# Patient Record
Sex: Female | Born: 1970 | Hispanic: Yes | Marital: Married | Smoking: Never smoker
Health system: Southern US, Community
[De-identification: ages and names within clinical notes are randomized; demographics above are authoritative.]

## PROBLEM LIST (undated history)

## (undated) ENCOUNTER — Inpatient Hospital Stay (HOSPITAL_COMMUNITY): Payer: Self-pay

## (undated) DIAGNOSIS — F419 Anxiety disorder, unspecified: Secondary | ICD-10-CM

## (undated) DIAGNOSIS — N390 Urinary tract infection, site not specified: Secondary | ICD-10-CM

## (undated) DIAGNOSIS — F329 Major depressive disorder, single episode, unspecified: Secondary | ICD-10-CM

## (undated) DIAGNOSIS — K649 Unspecified hemorrhoids: Secondary | ICD-10-CM

## (undated) DIAGNOSIS — N809 Endometriosis, unspecified: Secondary | ICD-10-CM

## (undated) DIAGNOSIS — F32A Depression, unspecified: Secondary | ICD-10-CM

## (undated) DIAGNOSIS — N926 Irregular menstruation, unspecified: Secondary | ICD-10-CM

## (undated) HISTORY — DX: Major depressive disorder, single episode, unspecified: F32.9

## (undated) HISTORY — DX: Irregular menstruation, unspecified: N92.6

## (undated) HISTORY — PX: APPENDECTOMY: SHX54

## (undated) HISTORY — DX: Anxiety disorder, unspecified: F41.9

## (undated) HISTORY — DX: Urinary tract infection, site not specified: N39.0

## (undated) HISTORY — DX: Depression, unspecified: F32.A

---

## 1999-02-18 ENCOUNTER — Encounter: Payer: Self-pay | Admitting: Family Medicine

## 1999-02-18 ENCOUNTER — Ambulatory Visit (HOSPITAL_COMMUNITY): Admission: RE | Admit: 1999-02-18 | Discharge: 1999-02-18 | Payer: Self-pay | Admitting: Family Medicine

## 1999-12-31 ENCOUNTER — Inpatient Hospital Stay (HOSPITAL_COMMUNITY): Admission: EM | Admit: 1999-12-31 | Discharge: 2000-01-01 | Payer: Self-pay

## 2000-01-05 ENCOUNTER — Emergency Department (HOSPITAL_COMMUNITY): Admission: EM | Admit: 2000-01-05 | Discharge: 2000-01-05 | Payer: Self-pay | Admitting: Emergency Medicine

## 2000-01-24 ENCOUNTER — Encounter: Payer: Self-pay | Admitting: Obstetrics and Gynecology

## 2000-01-24 ENCOUNTER — Ambulatory Visit (HOSPITAL_COMMUNITY): Admission: RE | Admit: 2000-01-24 | Discharge: 2000-01-24 | Payer: Self-pay | Admitting: Obstetrics and Gynecology

## 2000-02-29 ENCOUNTER — Encounter: Admission: RE | Admit: 2000-02-29 | Discharge: 2000-02-29 | Payer: Self-pay | Admitting: Obstetrics

## 2000-10-25 ENCOUNTER — Ambulatory Visit (HOSPITAL_COMMUNITY): Admission: RE | Admit: 2000-10-25 | Discharge: 2000-10-25 | Payer: Self-pay | Admitting: Family Medicine

## 2000-10-25 ENCOUNTER — Encounter: Payer: Self-pay | Admitting: Family Medicine

## 2000-10-31 ENCOUNTER — Inpatient Hospital Stay (HOSPITAL_COMMUNITY): Admission: AD | Admit: 2000-10-31 | Discharge: 2000-10-31 | Payer: Self-pay | Admitting: *Deleted

## 2000-11-06 ENCOUNTER — Encounter: Payer: Self-pay | Admitting: Family Medicine

## 2000-11-06 ENCOUNTER — Ambulatory Visit (HOSPITAL_COMMUNITY): Admission: RE | Admit: 2000-11-06 | Discharge: 2000-11-06 | Payer: Self-pay | Admitting: Family Medicine

## 2000-11-30 ENCOUNTER — Ambulatory Visit (HOSPITAL_COMMUNITY): Admission: RE | Admit: 2000-11-30 | Discharge: 2000-11-30 | Payer: Self-pay | Admitting: Family Medicine

## 2000-11-30 ENCOUNTER — Encounter: Payer: Self-pay | Admitting: Family Medicine

## 2001-04-02 ENCOUNTER — Ambulatory Visit (HOSPITAL_COMMUNITY): Admission: RE | Admit: 2001-04-02 | Discharge: 2001-04-02 | Payer: Self-pay | Admitting: Family Medicine

## 2001-04-02 ENCOUNTER — Encounter: Payer: Self-pay | Admitting: Family Medicine

## 2001-05-02 ENCOUNTER — Ambulatory Visit (HOSPITAL_COMMUNITY): Admission: RE | Admit: 2001-05-02 | Discharge: 2001-05-02 | Payer: Self-pay | Admitting: Obstetrics

## 2001-05-17 ENCOUNTER — Encounter: Admission: RE | Admit: 2001-05-17 | Discharge: 2001-05-17 | Payer: Self-pay | Admitting: Obstetrics

## 2001-06-08 ENCOUNTER — Encounter: Admission: RE | Admit: 2001-06-08 | Discharge: 2001-06-08 | Payer: Self-pay | Admitting: Obstetrics

## 2001-06-22 ENCOUNTER — Encounter: Admission: RE | Admit: 2001-06-22 | Discharge: 2001-06-22 | Payer: Self-pay | Admitting: Obstetrics & Gynecology

## 2001-07-24 ENCOUNTER — Encounter: Admission: RE | Admit: 2001-07-24 | Discharge: 2001-07-24 | Payer: Self-pay | Admitting: Obstetrics & Gynecology

## 2001-07-30 ENCOUNTER — Encounter: Admission: RE | Admit: 2001-07-30 | Discharge: 2001-07-30 | Payer: Self-pay | Admitting: Obstetrics

## 2001-09-13 ENCOUNTER — Encounter: Admission: RE | Admit: 2001-09-13 | Discharge: 2001-09-13 | Payer: Self-pay | Admitting: Obstetrics

## 2001-09-27 ENCOUNTER — Encounter: Admission: RE | Admit: 2001-09-27 | Discharge: 2001-09-27 | Payer: Self-pay | Admitting: Obstetrics

## 2001-10-10 ENCOUNTER — Encounter: Admission: RE | Admit: 2001-10-10 | Discharge: 2001-10-10 | Payer: Self-pay | Admitting: Internal Medicine

## 2001-10-15 ENCOUNTER — Ambulatory Visit (HOSPITAL_COMMUNITY): Admission: RE | Admit: 2001-10-15 | Discharge: 2001-10-15 | Payer: Self-pay | Admitting: Internal Medicine

## 2001-10-15 ENCOUNTER — Encounter: Payer: Self-pay | Admitting: Internal Medicine

## 2001-11-06 ENCOUNTER — Encounter: Admission: RE | Admit: 2001-11-06 | Discharge: 2001-11-06 | Payer: Self-pay | Admitting: *Deleted

## 2001-12-20 ENCOUNTER — Encounter: Admission: RE | Admit: 2001-12-20 | Discharge: 2001-12-20 | Payer: Self-pay | Admitting: Obstetrics and Gynecology

## 2002-03-21 ENCOUNTER — Encounter: Admission: RE | Admit: 2002-03-21 | Discharge: 2002-03-21 | Payer: Self-pay | Admitting: Obstetrics and Gynecology

## 2002-04-29 ENCOUNTER — Encounter: Admission: RE | Admit: 2002-04-29 | Discharge: 2002-04-29 | Payer: Self-pay | Admitting: Internal Medicine

## 2002-05-22 ENCOUNTER — Encounter: Admission: RE | Admit: 2002-05-22 | Discharge: 2002-05-22 | Payer: Self-pay | Admitting: Internal Medicine

## 2002-05-29 ENCOUNTER — Encounter: Admission: RE | Admit: 2002-05-29 | Discharge: 2002-05-29 | Payer: Self-pay | Admitting: Internal Medicine

## 2002-06-26 ENCOUNTER — Inpatient Hospital Stay (HOSPITAL_COMMUNITY): Admission: AD | Admit: 2002-06-26 | Discharge: 2002-06-26 | Payer: Self-pay | Admitting: Family Medicine

## 2003-05-08 ENCOUNTER — Encounter: Payer: Self-pay | Admitting: Obstetrics & Gynecology

## 2003-05-08 ENCOUNTER — Inpatient Hospital Stay (HOSPITAL_COMMUNITY): Admission: AD | Admit: 2003-05-08 | Discharge: 2003-05-08 | Payer: Self-pay | Admitting: Obstetrics & Gynecology

## 2003-10-01 ENCOUNTER — Inpatient Hospital Stay (HOSPITAL_COMMUNITY): Admission: AD | Admit: 2003-10-01 | Discharge: 2003-10-02 | Payer: Self-pay | Admitting: Obstetrics and Gynecology

## 2003-10-28 ENCOUNTER — Encounter: Admission: RE | Admit: 2003-10-28 | Discharge: 2003-10-28 | Payer: Self-pay | Admitting: Obstetrics and Gynecology

## 2003-11-20 ENCOUNTER — Inpatient Hospital Stay (HOSPITAL_COMMUNITY): Admission: AD | Admit: 2003-11-20 | Discharge: 2003-11-22 | Payer: Self-pay | Admitting: Obstetrics & Gynecology

## 2003-12-03 ENCOUNTER — Encounter: Admission: RE | Admit: 2003-12-03 | Discharge: 2003-12-03 | Payer: Self-pay | Admitting: *Deleted

## 2003-12-10 ENCOUNTER — Encounter: Admission: RE | Admit: 2003-12-10 | Discharge: 2003-12-10 | Payer: Self-pay | Admitting: *Deleted

## 2003-12-17 ENCOUNTER — Encounter: Admission: RE | Admit: 2003-12-17 | Discharge: 2003-12-17 | Payer: Self-pay | Admitting: Obstetrics & Gynecology

## 2003-12-20 ENCOUNTER — Inpatient Hospital Stay (HOSPITAL_COMMUNITY): Admission: AD | Admit: 2003-12-20 | Discharge: 2003-12-23 | Payer: Self-pay | Admitting: *Deleted

## 2003-12-31 ENCOUNTER — Inpatient Hospital Stay (HOSPITAL_COMMUNITY): Admission: AD | Admit: 2003-12-31 | Discharge: 2003-12-31 | Payer: Self-pay | Admitting: Obstetrics and Gynecology

## 2004-01-05 ENCOUNTER — Inpatient Hospital Stay (HOSPITAL_COMMUNITY): Admission: AD | Admit: 2004-01-05 | Discharge: 2004-01-05 | Payer: Self-pay | Admitting: Obstetrics and Gynecology

## 2005-05-23 ENCOUNTER — Encounter: Admission: RE | Admit: 2005-05-23 | Discharge: 2005-05-23 | Payer: Self-pay | Admitting: Gastroenterology

## 2011-01-31 ENCOUNTER — Encounter: Payer: Self-pay | Admitting: Obstetrics and Gynecology

## 2011-05-02 ENCOUNTER — Encounter: Payer: Self-pay | Admitting: Obstetrics & Gynecology

## 2011-05-02 ENCOUNTER — Ambulatory Visit (INDEPENDENT_AMBULATORY_CARE_PROVIDER_SITE_OTHER): Payer: Self-pay | Admitting: Obstetrics & Gynecology

## 2011-05-02 VITALS — BP 128/86 | HR 73 | Temp 97.7°F | Wt 138.2 lb

## 2011-05-02 DIAGNOSIS — N912 Amenorrhea, unspecified: Secondary | ICD-10-CM

## 2011-05-02 LAB — TSH: TSH: 1.773 u[IU]/mL (ref 0.350–4.500)

## 2011-05-02 MED ORDER — MEDROXYPROGESTERONE ACETATE 10 MG PO TABS: 10.0000 mg | ORAL_TABLET | Freq: Every day | ORAL | Status: DC

## 2011-05-02 NOTE — Patient Instructions (Signed)
Amenorrea primaria Falta del perodo, [place] de menstruacin (Primary Amenorrhea, Absent Menses) La amenorrea primaria es la ausencia del flujo menstrual en una mujer que nunca ha menstruado a la edad de 16 aos. La edad promedio para el inicio de la menstruacin es doce aos. No se considera amenorrea primaria hasta que la nia tiene ms de 16 aos y no se ha producido Programmer, systems. Puede producirse en presencia o ausencia de otros signos fsicos de pubertad. CAUSAS Algunas causas comunes de la falta de menstruacin son:  Desnutricin  Hipoglucemia   Enfermedad poliqustica de los ovarios   La ausencia congnita de la vagina, tero u ovarios.   Obesidad extrema  Fibrosis qustica   Reduccin de peso drstica por cualquier causa   Encima de ejercer, corriendo, andando en bicicleta, la prdida causando etc. de grasa del cuerpo.   El tumor de la glndula pituitaria en el cerebro.   Enfermedad crnica (de larga duracin)  Enfermedad de [place]   Hipotiroidismo e hipertiroidismo   La parte del cerebro (el hypothalamus) no funcionando.   El fracaso ovrico prematuro.   DIAGNSTICO Este diagnstico se realiza por medio de la historia clnica y el examen fsico. Con frecuencia se solicitan exmenes de sangre para evaluar las hormonas, junto con algunos anlisis de [place]. Tambin pueden tomarse radiografas especficas. Debe descartarse la posibilidad de embarazo. TRATAMIENTO El tratamiento depende de la causa de la [place]. Si hay un trastorno de Psychologist, sport and exercise, deber tratarse con la dieta y la terapia [place]. Los trastornos crnicos pueden mejorar con el tratamiento de la enfermedad. En general, el pronstico es bueno y Biomedical engineer puede corregirse con medicamentos, cambios en el estilo de vida o con [place]. Si la amenorrea no puede corregirse, algunas veces es posible crear una falsa menstruacin con medicamentos para ayudar a las mujeres jvenes a que se sientan  ms normales. Si tuviera trastornos emocionales, el profesional que la asiste podr [place].  Document Released: 08/08/2005 Document Re-Released: 02/19/2007 [facility] Patient Information 2011 [place], [place].

## 2011-05-02 NOTE — Progress Notes (Signed)
  Subjective:    Meghan Walker is a 40 y.o. G3P3 female who presents for evaluation of amenorrhea.  She reports amenorrhea for the past two years, was evaluated by another physician who could not find an etiology. Reports taking pills to induce period but "they did not work".  After her last pregnancy in around 2006, she was on Depo Provera and then OCPs; last OCP use was three months ago. She reports having one day of mild bleeding on 04/14/11; cannot recall having any bleeding prior to that.  She denies any other symptoms. HCG lab test done? no, negative. Factors that may be contributory to menstrual abnormalities include: hormonal factors Recently stopped oral contraceptive pills/DepoProvera and recent stressors are routine life stresses. Previous treatments for menstrual abnormalities include: oral contraceptive pills, ineffective.  Menstrual History: OB History    Grav Para Term Preterm Abortions TAB SAB Ect Mult Living   3 3 3  0 0 0 0 0 0 3     Patient's last menstrual period was 04/14/2011.    The following portions of the patient's history were reviewed and updated as appropriate: allergies, current medications, past family history, past medical history, past social history, past surgical history and problem list.  Review of Systems A comprehensive review of systems was negative.    Objective:    BP 128/86  Pulse 73  Temp(Src) 97.7 F (36.5 C) (Oral)  Wt 138 lb 3.2 oz (62.687 kg)  LMP 04/14/2011 General:   alert  Skin:   normal and no rash or abnormalities  Lungs:   clear to auscultation bilaterally  Heart:   regular rate and rhythm  Breasts:   not done  Abdomen:  soft, non-tender; bowel sounds normal; no masses,  no organomegaly  Pelvis:  Exam deferred.     Assessment:    The patient has amenorrhea.    Plan:    All questions answered. Will check amenorrhea labs and pelvic ultrasound.  Follow up results and manage accordingly.  Provera challenge given to  patient.  Will follow up in about 3 weeks or earlier if needed.

## 2011-05-03 LAB — FOLLICLE STIMULATING HORMONE: FSH: 4.6 m[IU]/mL

## 2011-05-03 LAB — TESTOSTERONE, FREE, TOTAL, SHBG
Sex Hormone Binding: 59 nmol/L (ref 18–114)
Testosterone, Free: 5.1 pg/mL (ref 0.6–6.8)

## 2011-05-03 LAB — HCG, QUANTITATIVE, PREGNANCY: hCG, Beta Chain, Quant, S: <2 m[IU]/mL

## 2011-05-05 ENCOUNTER — Ambulatory Visit (HOSPITAL_COMMUNITY)
Admission: RE | Admit: 2011-05-05 | Discharge: 2011-05-05 | Disposition: A | Discharge status: Home / Self Care | Payer: Self-pay | Source: Ambulatory Visit | Attending: Obstetrics & Gynecology | Admitting: Obstetrics & Gynecology

## 2011-05-05 DIAGNOSIS — N912 Amenorrhea, unspecified: Secondary | ICD-10-CM | POA: Insufficient documentation

## 2011-05-27 ENCOUNTER — Encounter: Payer: Self-pay | Admitting: Obstetrics and Gynecology

## 2011-05-27 ENCOUNTER — Ambulatory Visit (INDEPENDENT_AMBULATORY_CARE_PROVIDER_SITE_OTHER): Payer: Self-pay | Admitting: Obstetrics and Gynecology

## 2011-05-27 VITALS — BP 116/77 | HR 84 | Temp 97.6°F | Ht <= 58 in | Wt 135.3 lb

## 2011-05-27 DIAGNOSIS — N926 Irregular menstruation, unspecified: Secondary | ICD-10-CM

## 2011-05-27 MED ORDER — NORGESTREL-ETHINYL ESTRADIOL 0.3-30 MG-MCG PO TABS: 1.0000 | ORAL_TABLET | Freq: Every day | ORAL | Status: DC

## 2011-05-27 NOTE — Progress Notes (Signed)
S: Pt presents today for f/u of delayed menses. She was given a progesterone challenge and had amenorrhea labs. She states she did have menses in September. She has no complaints at this time. She does not desire pregnancy in the near future and would be interested in OCPs. All information was obtained via interpreter. O: A&O x 3 in NAD Abd soft, nontender to palpation All labs reviewed and were NL [place] was NL A/P: Irregular menses: discussed all tx options including OCPs. Pt desires OCPs. Will give Rx for Lo/Ovral x 76yr. She will return for yearly pap and PE. Discussed diet, activity, risks, and precautions.  Clinton Gallant. Rice III, DrHSc, MPAS, PA-C

## 2013-12-05 ENCOUNTER — Inpatient Hospital Stay (HOSPITAL_COMMUNITY)
Admission: AD | Admit: 2013-12-05 | Discharge: 2013-12-05 | Disposition: A | Discharge status: Home / Self Care | Payer: Self-pay | Source: Ambulatory Visit | Attending: Obstetrics & Gynecology | Admitting: Obstetrics & Gynecology

## 2013-12-05 ENCOUNTER — Encounter (HOSPITAL_COMMUNITY): Payer: Self-pay | Admitting: *Deleted

## 2013-12-05 DIAGNOSIS — N6019 Diffuse cystic mastopathy of unspecified breast: Secondary | ICD-10-CM

## 2013-12-05 DIAGNOSIS — N644 Mastodynia: Secondary | ICD-10-CM | POA: Insufficient documentation

## 2013-12-05 DIAGNOSIS — N6011 Diffuse cystic mastopathy of right breast: Secondary | ICD-10-CM

## 2013-12-05 DIAGNOSIS — N6012 Diffuse cystic mastopathy of left breast: Secondary | ICD-10-CM

## 2013-12-05 LAB — POCT PREGNANCY, URINE: Preg Test, Ur: NEGATIVE

## 2013-12-05 MED ORDER — KETOROLAC TROMETHAMINE 60 MG/2ML IM SOLN
60.0000 mg | INTRAMUSCULAR | Status: AC
  Administered 2013-12-05: 60 mg via INTRAMUSCULAR
  Filled 2013-12-05: qty 2

## 2013-12-05 NOTE — MAU Provider Note (Signed)
Chief Complaint: Breast Pain   None    SUBJECTIVE HPI: Meghan Walker is a 43 y.o. [phone] who presents to maternity admissions reporting bilateral breast pain.  She reports pain 1 month ago that resolved, with return of pain this month.  The pain is in both breasts, but worse on right.  She denies abdominal pain, vaginal bleeding, vaginal itching/burning, urinary symptoms, h/a, dizziness, n/v, or fever/chills.     Past Medical History  Diagnosis Date  . UTI (lower urinary tract infection)   . Depression   . Irregular menstruation    Past Surgical History  Procedure Laterality Date  . Cesarean section    . Appendectomy     History   Social History  . Marital Status: Married    Spouse Name: N/A    Number of Children: N/A  . Years of Education: N/A   Occupational History  . Not on file.   Social History Main Topics  . Smoking status: Never Smoker   . Smokeless tobacco: Never Used  . Alcohol Use: 0.6 oz/week    1 Cans of beer per week  . Drug Use: No  . Sexual Activity: Yes    Birth Control/ Protection: None   Other Topics Concern  . Not on file   Social History Narrative  . No narrative on file   No current facility-administered medications on file prior to encounter.   No current outpatient prescriptions on file prior to encounter.   No Known Allergies  ROS: Pertinent items in HPI  OBJECTIVE Blood pressure 112/70, pulse 71, temperature 98.1 F (36.7 C), temperature source Oral, resp. rate 16, height 4' 8.5" (1.435 m), weight 59.421 kg (131 lb), last menstrual period 12/04/2013. GENERAL: Well-developed, well-nourished female in no acute distress.  HEENT: Normocephalic HEART: normal rate RESP: normal effort BREAST: breasts appear normal, no suspicious masses, no skin or nipple changes or axillary nodes, symmetric fibrous changes in both upper outer quadrants ABDOMEN: Soft, non-tender EXTREMITIES: Nontender, no edema NEURO: Alert and  oriented  LAB RESULTS Results for orders placed during the hospital encounter of 12/05/13 (from the past 24 hour(s))  POCT PREGNANCY, URINE     Status: None   Collection Time    12/05/13  9:14 AM      Result Value Ref Range   Preg Test, Ur NEGATIVE  NEGATIVE    IMAGING No results found.  ASSESSMENT 1. Fibrocystic breast changes of both breasts     PLAN Discharge home Called [facility] but no funding currently. Recommended [facility]. [facility] tomorrow Pt able to make payments toward service F/U as needed    Medication List         ibuprofen 200 MG tablet  Commonly known as:  ADVIL,MOTRIN  Take 600 mg by mouth every 6 (six) hours as needed for mild pain.           Follow-up Information   Follow up with [facility]. (As needed.  Usted tiene una cita con [facility] a las 10:30 horas de [place], 12/06/13. Esto est al [facility]    Specialty:  Diagnostic Radiology   Contact information:   [address] [place] [address] [phone]       Sharen CounterLisa Leftwich-Kirby Certified Nurse-Midwife 12/05/2013  9:36 AM

## 2013-12-05 NOTE — MAU Note (Signed)
Pain in breast started last wk.  No other complaints.  Period started earlier than usual

## 2013-12-05 NOTE — Discharge Instructions (Signed)
Cambios fibroquísticos en las mamas  (Fibrocystic Breast Changes)  Los cambios fibroquísticos en las mamas ocurren cuando los conductos mamarios se obstruyen y se producen bultos llenos de líquidoquistes) en las mamas. Esta es una enfermedad frecuente que no es cancerosa (es benigna). Aparece cuando una mujer atraviesa cambios hormonales durante su período menstrual. Los cambios fibroquísticos en las mamas pueden afectar una o ambas mamas.  CAUSAS   La causa exacta de los cambios fibroquísticos en las mamas no se conocen, pero pueden relacionarse con las hormonas femeninas, estrógeno y progesterona. Los rasgos familiares que se transmiten de padres a hijos (genéticos) pueden ser un factor en algunos casos.  SIGNOS Y SÍNTOMAS   · Sensibilidad, molestias leves o dolor.  · Hinchazón.  · Sensación de tocar cordones en la mama.  · Mama con bultos, de uno o ambos lados.  · Cambios en el tamaño de la mama, especialmente antes (más grandes) y después (más pequeños) del período menstrual.  · Secreción de color verde o marrón oscura por el pezón (que no es sangre).  Los síntomas generalmente empeoran antes del inicio de los períodos menstruales y mejoran hacia el final de los mismos.   DIAGNÓSTICO   Para diagnosticarlo, el médico le hará preguntas y realizará un examen físico de las mamas. El médico podrá indicar otros estudios que examinarán el interior de las mamas, como:  · Un estudio radiográfico de las mamas (mamografía).  · Ecografías.  · Un estudio de imagen por resonancia magnética.  Si se sospecha algo más que cambios fibroquísticos en las mamas, el médico podrá tomar una muestra de tejido mamario. (biopsia mamaria) para ser examinado.  TRATAMIENTO   En general no se requiere tratamiento. El médico podrá indicar analgésicos de venta libre para disminuir el dolor o las molestias causadas por los cambios fibroquísticos en las mamas. También podrán indicarle que cambie la dieta o que limite o deje de consumir algunos  alimentos o beber líquidos que contengan cafeína. Los alimentos y las bebidas que contienen cafeína incluyen el chocolate, las gaseosas, el café y el té. También puede ayudar si reduce el consumo de azúcar y grasas. El médico también podrá indicar:  · Aspiración con aguja fina para retirar el líquido de un quiste que le cause dolor.  · Cirugía para extirpar un quiste grande, persistente y sensible.  INSTRUCCIONES PARA EL CUIDADO EN EL HOGAR   · Examine sus mamas después de cada período menstrual. Controle sus mamas el primer día de cada mes, si no tiene el período menstrual. Palpe para detectar cambios, como más sensibilidad, un bulto que no estaba, cambios en el tamaño de las mamas o cambios en un bulto que siempre tuvo.  · Tome sólo medicamentos de venta libre o recetados, según las indicaciones del médico.  · Use un sostén de soporte o un sostén deportivo que le ajuste bien, especialmente al hacer ejercicios.  · Disminuya o evite la cafeína, las grasas y el azúcar en su dieta, según las indicaciones de su médico.  SOLICITE ATENCIÓN MÉDICA SI:   · Observa una pérdida de líquido (secreción) por los pezones, especialmente si es un líquido sanguinolento.  · Tiene nuevos bultos o nódulos en la mama.  · Una o ambas mamas se agrandan, están irritadas o le duelen.  · Tiene zonas en las mamas que se hunden.  · Los pezones están planos o sangrantes.  Document Released: 11/24/2008 Document Revised: 04/10/2013  Exit[employer]® Patient Information ©2014 [employer].

## 2013-12-09 NOTE — MAU Provider Note (Signed)

## 2014-03-28 ENCOUNTER — Ambulatory Visit: Payer: Self-pay | Attending: Internal Medicine

## 2014-05-12 ENCOUNTER — Encounter: Payer: Self-pay | Admitting: Internal Medicine

## 2014-05-12 ENCOUNTER — Ambulatory Visit: Payer: Self-pay | Attending: Internal Medicine | Admitting: Internal Medicine

## 2014-05-12 VITALS — BP 124/80 | HR 100 | Temp 98.0°F | Resp 16 | Wt 140.0 lb

## 2014-05-12 DIAGNOSIS — K649 Unspecified hemorrhoids: Secondary | ICD-10-CM | POA: Insufficient documentation

## 2014-05-12 DIAGNOSIS — Z139 Encounter for screening, unspecified: Secondary | ICD-10-CM

## 2014-05-12 DIAGNOSIS — Z87440 Personal history of urinary (tract) infections: Secondary | ICD-10-CM | POA: Insufficient documentation

## 2014-05-12 MED ORDER — HYDROCORTISONE ACETATE 25 MG RE SUPP: 25.0000 mg | Freq: Two times a day (BID) | RECTAL | Status: DC

## 2014-05-12 NOTE — Progress Notes (Signed)
Patient here to establish care Complains of rectal bleeding when she has a bowel movement States it is bright red and getting worse

## 2014-05-12 NOTE — Progress Notes (Signed)
Patient Demographics  Meghan Walker, is a 43 y.o. female  ZOX:[id number]  WJX:[id number]  DOB - [birth year]  CC:  Chief Complaint  Patient presents with  . Establish Care       HPI: Meghan Walker is a 43 y.o. female here today to establish medical care. Patient reported to have noticed blood when she wipes after having bowel movement, patient tried over-the-counter hydrocortisone cream with some improvement in the symptoms denies any nausea vomiting any abdominal pain, she has history of UTI but currently denies any symptoms, patient denies any heavy bleeding during menstrual periods, patient is up-to-date with Pap smear and mammogram. Patient has No headache, No chest pain, No abdominal pain - No Nausea, No new weakness tingling or numbness, No Cough - SOB.  No Known Allergies Past Medical History  Diagnosis Date  . UTI (lower urinary tract infection)   . Depression   . Irregular menstruation    Current Outpatient Prescriptions on File Prior to Visit  Medication Sig Dispense Refill  . ibuprofen (ADVIL,MOTRIN) 200 MG tablet Take 600 mg by mouth every 6 (six) hours as needed for mild pain.       No current facility-administered medications on file prior to visit.   Family History  Problem Relation Age of Onset  . Heart murmur Mother   . Ulcers Mother   . Hypertension Father   . Kidney disease Father   . Ulcers Father   . Stroke Maternal Grandmother    History   Social History  . Marital Status: Married    Spouse Name: N/A    Number of Children: N/A  . Years of Education: N/A   Occupational History  . Not on file.   Social History Main Topics  . Smoking status: Never Smoker   . Smokeless tobacco: Never Used  . Alcohol Use: 0.6 oz/week    1 Cans of beer per week  . Drug Use: No  . Sexual Activity: Yes    Birth Control/ Protection: None   Other Topics Concern  . Not on file   Social History Narrative  . No narrative on file     Review of Systems: Constitutional: Negative for fever, chills, diaphoresis, activity change, appetite change and fatigue. HENT: Negative for ear pain, nosebleeds, congestion, facial swelling, rhinorrhea, neck pain, neck stiffness and ear discharge.  Eyes: Negative for pain, discharge, redness, itching and visual disturbance. Respiratory: Negative for cough, choking, chest tightness, shortness of breath, wheezing and stridor.  Cardiovascular: Negative for chest pain, palpitations and leg swelling. Gastrointestinal: Negative for abdominal distention. Genitourinary: Negative for dysuria, urgency, frequency, hematuria, flank pain, decreased urine volume, difficulty urinating and dyspareunia.  Musculoskeletal: Negative for back pain, joint swelling, arthralgia and gait problem. Neurological: Negative for dizziness, tremors, seizures, syncope, facial asymmetry, speech difficulty, weakness, light-headedness, numbness and headaches.  Hematological: Negative for adenopathy. Does not bruise/bleed easily. Psychiatric/Behavioral: Negative for hallucinations, behavioral problems, confusion, dysphoric mood, decreased concentration and agitation.    Objective:   Filed Vitals:   05/12/14 1500  BP: 124/80  Pulse: 100  Temp: 98 F (36.7 C)  Resp: 16    Physical Exam: Constitutional: Patient appears well-developed and well-nourished. No distress. HENT: Normocephalic, atraumatic, External right and left ear normal. Oropharynx is clear and moist.  Eyes: Conjunctivae and EOM are normal. PERRLA, no scleral icterus. Neck: Normal ROM. Neck supple. No JVD. No tracheal deviation. No thyromegaly. CVS: RRR, S1/S2 +, no murmurs, no gallops, no carotid bruit.  Pulmonary: Effort and  breath sounds normal, no stridor, rhonchi, wheezes, rales.  Abdominal: Soft. BS +, no distension, tenderness, rebound or guarding.  Musculoskeletal: Normal range of motion. No edema and no tenderness.  Neuro: Alert. Normal  reflexes, muscle tone coordination. No cranial nerve deficit. Skin: Skin is warm and dry. No rash noted. Not diaphoretic. No erythema. No pallor. Psychiatric: Normal mood and affect. Behavior, judgment, thought content normal.  No results found for this basename: WBC, HGB, HCT, MCV, PLT   No results found for this basename: CREATININE, BUN, NA, K, CL, CO2    Lab Results  Component Value Date   HGBA1C 5.6 05/02/2011   Lipid Panel  No results found for this basename: chol, trig, hdl, cholhdl, vldl, ldlcalc       Assessment and plan:   1. Hemorrhoids, unspecified hemorrhoid type I have advised patient to increase fiber in her diet to prevent constipation. - hydrocortisone (ANUSOL-HC) 25 MG suppository; Place 1 suppository (25 mg total) rectally 2 (two) times daily.  Dispense: 12 suppository; Refill: 0  2. Screening Ordered baseline blood work. - CBC with Differential - COMPLETE METABOLIC PANEL WITH GFR - TSH - Lipid panel - Hemoglobin A1c        Health Maintenance  -Pap Smear: uptodate  -Mammogram: uptodate    Return in about 3 months (around 08/11/2014).    Doris Cheadle, MD

## 2014-05-13 LAB — COMPLETE METABOLIC PANEL WITH GFR
ALT: 51 U/L — ABNORMAL HIGH (ref 0–35)
AST: 35 U/L (ref 0–37)
Albumin: 4.3 g/dL (ref 3.5–5.2)
Alkaline Phosphatase: 65 U/L (ref 39–117)
BUN: 12 mg/dL (ref 6–23)
CHLORIDE: 102 meq/L (ref 96–112)
CO2: 29 mEq/L (ref 19–32)
CREATININE: 0.57 mg/dL (ref 0.50–1.10)
Calcium: 9.7 mg/dL (ref 8.4–10.5)
GFR, Est African American: >89 mL/min
GFR, Est Non African American: >89 mL/min
Glucose, Bld: 94 mg/dL (ref 70–99)
Potassium: 4.2 mEq/L (ref 3.5–5.3)
Sodium: 138 mEq/L (ref 135–145)
Total Bilirubin: 0.3 mg/dL (ref 0.2–1.2)
Total Protein: 6.8 g/dL (ref 6.0–8.3)

## 2014-05-13 LAB — TSH: TSH: 1.958 u[IU]/mL (ref 0.350–4.500)

## 2014-05-13 LAB — LIPID PANEL
CHOL/HDL RATIO: 3.3 ratio
Cholesterol: 156 mg/dL (ref 0–200)
HDL: 48 mg/dL (ref >39)
LDL Cholesterol: 44 mg/dL (ref 0–99)
Triglycerides: 318 mg/dL — ABNORMAL HIGH (ref <150)
VLDL: 64 mg/dL — AB (ref 0–40)

## 2014-05-13 LAB — CBC WITH DIFFERENTIAL/PLATELET
BASOS ABS: 0 10*3/uL (ref 0.0–0.1)
Basophils Relative: 0 % (ref 0–1)
EOS ABS: 0.2 10*3/uL (ref 0.0–0.7)
Eosinophils Relative: 2 % (ref 0–5)
HCT: 38.7 % (ref 36.0–46.0)
HEMOGLOBIN: 12.8 g/dL (ref 12.0–15.0)
LYMPHS ABS: 3.8 10*3/uL (ref 0.7–4.0)
Lymphocytes Relative: 41 % (ref 12–46)
MCH: 29.4 pg (ref 26.0–34.0)
MCHC: 33.1 g/dL (ref 30.0–36.0)
MCV: 88.8 fL (ref 78.0–100.0)
MONOS PCT: 6 % (ref 3–12)
Monocytes Absolute: 0.6 10*3/uL (ref 0.1–1.0)
Neutro Abs: 4.7 10*3/uL (ref 1.7–7.7)
Neutrophils Relative %: 51 % (ref 43–77)
PLATELETS: 248 10*3/uL (ref 150–400)
RBC: 4.36 MIL/uL (ref 3.87–5.11)
RDW: 13.3 % (ref 11.5–15.5)
WBC: 9.2 10*3/uL (ref 4.0–10.5)

## 2014-05-13 LAB — HEMOGLOBIN A1C
Hgb A1c MFr Bld: 5.9 % — ABNORMAL HIGH (ref <5.7)
MEAN PLASMA GLUCOSE: 123 mg/dL — AB (ref <117)

## 2014-06-02 ENCOUNTER — Other Ambulatory Visit (HOSPITAL_COMMUNITY)
Admission: RE | Admit: 2014-06-02 | Discharge: 2014-06-02 | Disposition: A | Discharge status: Home / Self Care | Payer: Self-pay | Source: Ambulatory Visit | Attending: Emergency Medicine | Admitting: Emergency Medicine

## 2014-06-02 ENCOUNTER — Encounter (HOSPITAL_COMMUNITY): Payer: Self-pay | Admitting: Emergency Medicine

## 2014-06-02 ENCOUNTER — Emergency Department (INDEPENDENT_AMBULATORY_CARE_PROVIDER_SITE_OTHER)
Admission: EM | Admit: 2014-06-02 | Discharge: 2014-06-02 | Disposition: A | Discharge status: Home / Self Care | Payer: Self-pay | Source: Home / Self Care | Attending: Emergency Medicine | Admitting: Emergency Medicine

## 2014-06-02 DIAGNOSIS — Z113 Encounter for screening for infections with a predominantly sexual mode of transmission: Secondary | ICD-10-CM | POA: Insufficient documentation

## 2014-06-02 DIAGNOSIS — N730 Acute parametritis and pelvic cellulitis: Secondary | ICD-10-CM

## 2014-06-02 DIAGNOSIS — K2970 Gastritis, unspecified, without bleeding: Secondary | ICD-10-CM

## 2014-06-02 DIAGNOSIS — N760 Acute vaginitis: Secondary | ICD-10-CM | POA: Insufficient documentation

## 2014-06-02 DIAGNOSIS — N390 Urinary tract infection, site not specified: Secondary | ICD-10-CM

## 2014-06-02 LAB — POCT I-STAT, CHEM 8
BUN: 12 mg/dL (ref 6–23)
CALCIUM ION: 1.28 mmol/L — AB (ref 1.12–1.23)
CHLORIDE: 103 meq/L (ref 96–112)
Creatinine, Ser: 0.5 mg/dL (ref 0.50–1.10)
GLUCOSE: 92 mg/dL (ref 70–99)
HEMATOCRIT: 44 % (ref 36.0–46.0)
HEMOGLOBIN: 15 g/dL (ref 12.0–15.0)
Potassium: 3.9 mEq/L (ref 3.7–5.3)
Sodium: 140 mEq/L (ref 137–147)
TCO2: 25 mmol/L (ref 0–100)

## 2014-06-02 LAB — CBC WITH DIFFERENTIAL/PLATELET
BASOS ABS: 0 10*3/uL (ref 0.0–0.1)
Basophils Relative: 0 % (ref 0–1)
EOS ABS: 0.1 10*3/uL (ref 0.0–0.7)
Eosinophils Relative: 2 % (ref 0–5)
HCT: 39.2 % (ref 36.0–46.0)
Hemoglobin: 13.3 g/dL (ref 12.0–15.0)
Lymphocytes Relative: 45 % (ref 12–46)
Lymphs Abs: 3.4 10*3/uL (ref 0.7–4.0)
MCH: 29.8 pg (ref 26.0–34.0)
MCHC: 33.9 g/dL (ref 30.0–36.0)
MCV: 87.7 fL (ref 78.0–100.0)
MONO ABS: 0.5 10*3/uL (ref 0.1–1.0)
Monocytes Relative: 6 % (ref 3–12)
Neutro Abs: 3.4 10*3/uL (ref 1.7–7.7)
Neutrophils Relative %: 47 % (ref 43–77)
PLATELETS: 270 10*3/uL (ref 150–400)
RBC: 4.47 MIL/uL (ref 3.87–5.11)
RDW: 12.5 % (ref 11.5–15.5)
WBC: 7.4 10*3/uL (ref 4.0–10.5)

## 2014-06-02 LAB — POCT URINALYSIS DIP (DEVICE)
Bilirubin Urine: NEGATIVE
Glucose, UA: NEGATIVE mg/dL
Ketones, ur: NEGATIVE mg/dL
Leukocytes, UA: NEGATIVE
NITRITE: NEGATIVE
PH: 7 (ref 5.0–8.0)
PROTEIN: NEGATIVE mg/dL
Specific Gravity, Urine: 1.015 (ref 1.005–1.030)
Urobilinogen, UA: 0.2 mg/dL (ref 0.0–1.0)

## 2014-06-02 LAB — POCT PREGNANCY, URINE: Preg Test, Ur: NEGATIVE

## 2014-06-02 LAB — CERVICOVAGINAL ANCILLARY ONLY
WET PREP (BD AFFIRM): NEGATIVE
Wet Prep (BD Affirm): NEGATIVE
Wet Prep (BD Affirm): NEGATIVE

## 2014-06-02 LAB — POCT H PYLORI SCREEN: H. PYLORI SCREEN, POC: NEGATIVE

## 2014-06-02 MED ORDER — AZITHROMYCIN 250 MG PO TABS
ORAL_TABLET | ORAL | Status: AC
  Filled 2014-06-02: qty 4

## 2014-06-02 MED ORDER — CEFTRIAXONE SODIUM 250 MG IJ SOLR
INTRAMUSCULAR | Status: AC
  Filled 2014-06-02: qty 250

## 2014-06-02 MED ORDER — AZITHROMYCIN 250 MG PO TABS
1000.0000 mg | ORAL_TABLET | Freq: Once | ORAL | Status: AC
  Administered 2014-06-02: 1000 mg via ORAL

## 2014-06-02 MED ORDER — CEPHALEXIN 500 MG PO CAPS: 500.0000 mg | ORAL_CAPSULE | Freq: Three times a day (TID) | ORAL | Status: DC

## 2014-06-02 MED ORDER — LIDOCAINE HCL (PF) 1 % IJ SOLN
INTRAMUSCULAR | Status: AC
  Filled 2014-06-02: qty 5

## 2014-06-02 MED ORDER — OMEPRAZOLE 20 MG PO CPDR: 20.0000 mg | DELAYED_RELEASE_CAPSULE | Freq: Two times a day (BID) | ORAL | Status: DC

## 2014-06-02 MED ORDER — METRONIDAZOLE 500 MG PO TABS: 500.0000 mg | ORAL_TABLET | Freq: Three times a day (TID) | ORAL | Status: DC

## 2014-06-02 MED ORDER — CEFTRIAXONE SODIUM 250 MG IJ SOLR
250.0000 mg | Freq: Once | INTRAMUSCULAR | Status: AC
  Administered 2014-06-02: 250 mg via INTRAMUSCULAR

## 2014-06-02 NOTE — ED Notes (Signed)
Pt complains of pelvic pain that radiates up to her abdomen.

## 2014-06-02 NOTE — ED Provider Notes (Signed)
Chief Complaint   Pelvic Pain   History of Present Illness   Meghan Walker is a 43 year old female who has had a one-week history of dysuria, frequency, and urgency. She denies any hematuria. She's also had both pelvic pain and epigastric pain. The epigastric pain is worse if she eats or she lies down. The pelvic pain has been more continuous. She denies any fever or chills. Her appetite is good. There's been no nausea or vomiting. She denies any constipation, diarrhea, or blood in the stool. She denies any female problems such as discharge, itching, or abnormal bleeding.  Review of Systems   Other than as noted above, the patient denies any of the following symptoms: Constitutional:  No fever, chills, weight loss or anorexia. Abdomen:  No nausea, vomiting, hematememesis, melena, diarrhea, or hematochezia. GU:  No dysuria, frequency, urgency, or hematuria. Gyn:  No vaginal discharge, itching, abnormal bleeding, dyspareunia, or pelvic pain.  PMFSH   Past medical history, family history, social history, meds, and allergies were reviewed.   Physical Exam     Vital signs:  BP 117/81  Pulse 66  Temp(Src) 98.1 F (36.7 C) (Oral)  Resp 16  Ht 4' 8.5" (1.435 m)  Wt 130 lb (58.968 kg)  BMI 28.64 kg/m2  SpO2 100% Gen:  Alert, oriented, in no distress. Lungs:  Breath sounds clear and equal bilaterally.  No wheezes, rales or rhonchi. Heart:  Regular rhythm.  No gallops or murmers.   Abdomen:  There is moderate tenderness to palpation in the epigastrium without guarding or rebound. She has no pain to palpation in either left or upper or right upper quadrant and Murphy sign and Murphy's punch were negative. She does have moderate pain to palpation in her lower abdomen bilaterally. There was no guarding or rebound. No organomegaly or mass. Bowel sounds are normally active. Pelvic:  Normal external genitalia. Vaginal and cervical mucosa were normal. There was no discharge. She does  have pain on cervical motion. Uterus was normal in size and shape and moderately tender. She has moderate bilateral adnexal tenderness to palpation without any mass.  DNA probes for gonorrhea, Chlamydia, Trichomonas, Gardnerella, and Candida were obtained. Skin:  Clear, warm and dry.  No rash.  Labs   Results for orders placed during the hospital encounter of 06/02/14  CBC WITH DIFFERENTIAL      Result Value Ref Range   WBC 7.4  4.0 - 10.5 K/uL   RBC 4.47  3.87 - 5.11 MIL/uL   Hemoglobin 13.3  12.0 - 15.0 g/dL   HCT 18.839.2  41.636.0 - 60.646.0 %   MCV 87.7  78.0 - 100.0 fL   MCH 29.8  26.0 - 34.0 pg   MCHC 33.9  30.0 - 36.0 g/dL   RDW 30.112.5  60.111.5 - 09.315.5 %   Platelets 270  150 - 400 K/uL   Neutrophils Relative % 47  43 - 77 %   Neutro Abs 3.4  1.7 - 7.7 K/uL   Lymphocytes Relative 45  12 - 46 %   Lymphs Abs 3.4  0.7 - 4.0 K/uL   Monocytes Relative 6  3 - 12 %   Monocytes Absolute 0.5  0.1 - 1.0 K/uL   Eosinophils Relative 2  0 - 5 %   Eosinophils Absolute 0.1  0.0 - 0.7 K/uL   Basophils Relative 0  0 - 1 %   Basophils Absolute 0.0  0.0 - 0.1 K/uL  POCT URINALYSIS DIP (DEVICE)  Result Value Ref Range   Glucose, UA NEGATIVE  NEGATIVE mg/dL   Bilirubin Urine NEGATIVE  NEGATIVE   Ketones, ur NEGATIVE  NEGATIVE mg/dL   Specific Gravity, Urine 1.015  1.005 - 1.030   Hgb urine dipstick MODERATE (*) NEGATIVE   pH 7.0  5.0 - 8.0   Protein, ur NEGATIVE  NEGATIVE mg/dL   Urobilinogen, UA 0.2  0.0 - 1.0 mg/dL   Nitrite NEGATIVE  NEGATIVE   Leukocytes, UA NEGATIVE  NEGATIVE  POCT PREGNANCY, URINE      Result Value Ref Range   Preg Test, Ur NEGATIVE  NEGATIVE  POCT I-STAT, CHEM 8      Result Value Ref Range   Sodium 140  137 - 147 mEq/L   Potassium 3.9  3.7 - 5.3 mEq/L   Chloride 103  96 - 112 mEq/L   BUN 12  6 - 23 mg/dL   Creatinine, Ser 1.190.50  0.50 - 1.10 mg/dL   Glucose, Bld 92  70 - 99 mg/dL   Calcium, Ion 1.471.28 (*) 1.12 - 1.23 mmol/L   TCO2 25  0 - 100 mmol/L   Hemoglobin 15.0  12.0  - 15.0 g/dL   HCT 82.944.0  56.236.0 - 13.046.0 %  POCT H PYLORI SCREEN      Result Value Ref Range   H. PYLORI SCREEN, POC NEGATIVE  NEGATIVE  CERVICOVAGINAL ANCILLARY ONLY      Result Value Ref Range   BD affirm Candida: Negative     BD affirm Gardnerella: Negative     BD affirm Trichomonas: Negative     Urine was cultured.  Course in [facility]gent [facility] Center   The following medications were given:  Medications  cefTRIAXone (ROCEPHIN) injection 250 mg (250 mg Intramuscular Given 06/02/14 1207)  azithromycin (ZITHROMAX) tablet 1,000 mg (1,000 mg Oral Given 06/02/14 1208)   Assessment   The primary encounter diagnosis was UTI (lower urinary tract infection). Diagnoses of Gastritis and PID (acute pelvic inflammatory disease) were also pertinent to this visit.  Plan     1.  Meds:  The following meds were prescribed:   Discharge Medication List as of 06/02/2014 11:48 AM    START taking these medications   Details  cephALEXin (KEFLEX) 500 MG capsule Take 1 capsule (500 mg total) by mouth 3 (three) times daily., Starting 06/02/2014, Until Discontinued, Normal    metroNIDAZOLE (FLAGYL) 500 MG tablet Take 1 tablet (500 mg total) by mouth 3 (three) times daily., Starting 06/02/2014, Until Discontinued, Normal    omeprazole (PRILOSEC) 20 MG capsule Take 1 capsule (20 mg total) by mouth 2 (two) times daily before a meal., Starting 06/02/2014, Until Discontinued, Normal        2.  Patient Education/Counseling:  The patient was given appropriate handouts, self care instructions, and instructed in symptomatic relief.  She was given instructions on an ulcer or diet.  3.  Follow up:  The patient was told to follow up here if no better in 3 to 4 days, or sooner if becoming worse in any way, and given some red flag symptoms such as worsening pain, fever, vomiting, or evidence of GI bleeding which would prompt immediate return. Followup with her primary care physician in one week.    Reuben Likesavid C Liliani Bobo,  MD 06/02/14 2141

## 2014-06-02 NOTE — Discharge Instructions (Signed)
Enfermedad inflamatoria plvica (Pelvic Inflammatory Disease)  La enfermedad inflamatoria plvica (PID) se refiere a una infeccin en algunos o todos de los rganos femeninos. La infeccin puede estar en el tero, en los ovarios, en las trompas de [place] o en los tejidos circundantes en la pelvis. La PID puede causar un dolor abdominal o plvico que aparece repentinamente (dolor plvico agudo). La PID es una infeccin grave, ya que puede conducir a un dolor plvico duradero (crnico) o a la imposibilidad de Best boy hijos (infertilidad).  CAUSAS  La infeccin generalmente es causada por las bacterias normales que se encuentran en los tejidos vaginales. Otras causas son las infecciones que se transmiten durante el contacto sexual. Tambin puede aparecer despus de:   El nacimiento de un beb.   Un aborto espontneo.   Un aborto inducido.   Una ciruga plvica mayor.   El uso de un dispositivo intrauterino (DIU).   Una violacin sexual.  FACTORES DE RIESGO  Ciertos factores pueden aumentar el riesgo de PID, por ejemplo:   Ser menor de 25 aos de edad.  Ser sexualmente activa a una edad temprana.  El uso de anticonceptivos que no son de barrera.  Tener mltiples compaeros sexuales.  Tener relaciones sexuales con alguien que tiene sntomas de una infeccin genital.  El uso de anticonceptivos orales. Otras veces, ciertos factores pueden aumentar la posibilidad de sufrir una PID, por ejemplo:   Tener relaciones sexuales durante la menstruacin.  Usar una ducha vaginal.  Tener un DIU (dispositivo intrauterino) en su lugar. SNTOMAS   Dolor abdominal o plvico   Fiebre.   Escalofros.   Flujo vaginal anormal.  Sangrado uterino anormal.   Dolor inusual poco despus de finalizado el perodo. DIAGNSTICO  El mdico decidir algunos de los siguientes mtodos para hacer el diagnstico:   Optometrist un examen fsico y Ardelia Mems historia clnica. El examen plvico muestra el  tero y esa zona de la pelvis muy sensibles.   Pruebas de laboratorio, como un test de [place], anlisis de [place] y de [place].  Cultivos de la vagina y del cuello uterino para Hydrographic surveyor una infeccin de transmisin sexual (ITS).  Realizar una ecografa.  Una laparoscopia para observar el interior de la pelvis.  TRATAMIENTO   Se pueden prescribir antibiticos por va oral.   Las parejas sexuales deben tratarse cuando la infeccin es [place] en una enfermedad de transmisin sexual (ETS).   Puede que necesite ser hospitalizada para aplicarle los antibiticos por va intravenosa.  En algunos casos poco frecuentes es necesaria la [place]. Pueden pasar semanas hasta la completa curacin. Si se le diagnostica una PID, tambin debern realizarle estudios para descartar el virus de inmunodeficiencia humana (VIH).  [place] los antibiticos como le indic el mdico, si se los receta. Finalice el [place], aunque comience a Sports administrator.   Slo tome medicamentos de venta libre o recetados para Glass blower/designer, las molestias o bajar la fiebre segn las indicaciones de su mdico.   No tenga relaciones sexuales hasta completar el [place], o segn las indicaciones del mdico. Si se confirma la PID, sus compaeros sexuales recientes Youth worker.   [place] con las citas tal como se le indic. SOLICITE ATENCIN MDICA SI:   Margette Fast secrecin vaginal anormal o abundante.   Necesita medicamentos recetados para Conservation officer, historic buildings.   Vomita.   No puede tomar los medicamentos.   Su pareja tiene una enfermedad de transmisin sexual.  Jenne Pane MDICA  DE INMEDIATO SI:   Tiene fiebre.   Aumenta el dolor abdominal o plvico.   Siente escalofros.   Siente dolor al ConocoPhillipsorinar.   No mejora luego de 72 horas del tratamiento.  ASEGRESE DE QUE:   Comprende estas instrucciones.  Controlar su  enfermedad.  Solicitar ayuda de inmediato si no mejora o si empeora. Document Released: 05/18/2005 Document Revised: 12/03/2012 [facility] Patient Information 2015 [place] CityExitCare, MarylandLLC. This information is not intended to replace advice given to you by your health care provider. Make sure you discuss any questions you have with your health care provider.  Infeccin urinaria  (Urinary Tract Infection)  La infeccin urinaria puede ocurrir en Corporate treasurercualquier lugar del tracto urinario. El tracto urinario es un sistema de drenaje del cuerpo por el que se eliminan los desechos y el exceso de [place]. El tracto urinario est formado por dos riones, dos urteres, la vejiga y Engineer, miningla uretra. Los riones son rganos que tienen forma de frijol. Cada rin tiene aproximadamente el tamao del puo. Estn situados debajo de las [place], uno a cada lado de la columna vertebral CAUSAS  La causa de la infeccin son los microbios, que son organismos microscpicos, que incluyen hongos, virus, y bacterias. Estos organismos son tan pequeos que slo pueden verse a travs del microscopio. Las bacterias son los microorganismos que ms comnmente causan infecciones urinarias.  SNTOMAS  Los sntomas pueden variar segn la edad y el sexo del paciente y por la ubicacin de la infeccin. Los sntomas en las mujeres jvenes incluyen la necesidad frecuente e intensa de orinar y una sensacin dolorosa de ardor en la vejiga o en la uretra durante la miccin. Las mujeres y los hombres mayores podrn sentir cansancio, temblores y debilidad y Futures tradersentir dolores musculares y Engineer, miningdolor abdominal. Si tiene Cunardfiebre, puede significar que la infeccin est en los riones. Otros sntomas son dolor en la espalda o en los lados debajo de las Temple Terracecostillas, nuseas y vmitos.  DIAGNSTICO  Para diagnosticar una infeccin urinaria, el mdico le preguntar acerca de sus sntomas. Genuine Partsambin le solicitar una Pra[place] Citymuestra de [place]orina. La muestra de orina se analiza para Engineer, manufacturingdetectar bacterias  y glbulos blancos de Risk managerla sangre. Los glbulos blancos se forman en el organismo para ayudar a Artistcombatir las infecciones.  TRATAMIENTO  Por lo general, las infecciones urinarias pueden tratarse con medicamentos. Debido a que la Harley-Davidsonmayora de las infecciones son causadas por bacterias, por lo general pueden tratarse con antibiticos. La eleccin del antibitico y la duracin del tratamiento depender de sus sntomas y el tipo de bacteria causante de la infeccin.  INSTRUCCIONES PARA EL CUIDADO EN EL HOGAR   Si le recetaron antibiticos, tmelos exactamente como su mdico le indique. Termine el medicamento aunque se sienta mejor despus de haber tomado slo algunos.  Beba gran cantidad de lquido para mantener la orina de tono claro o color amarillo plido.  Evite la cafena, el t y las 250 Hospital Placebebidas gaseosas. Estas sustancias irritan la vejiga.  Vaciar la vejiga con frecuencia. Evite retener la orina durante largos perodos.  Vace la vejiga antes y despus de Management consultanttener relaciones sexuales.  Despus de mover el intestino, las mujeres deben higienizarse la regin perineal desde adelante hacia atrs. Use slo un papel tissue por vez. SOLICITE ATENCIN MDICA SI:   Siente dolor en la espalda.  Le sube la fiebre.  Los sntomas no mejoran luego de [address] das. SOLICITE ATENCIN MDICA DE INMEDIATO SI:   Siente dolor intenso en la espalda o en la zona inferior del abdomen.  Comienza a sentir escalofros.  Tiene nuseas o vmitos.  Tiene una sensacin continua de quemazn o molestias al ConocoPhillipsorinar. ASEGRESE DE QUE:   Comprende estas instrucciones.  Controlar su enfermedad.  Solicitar ayuda de inmediato si no mejora o empeora. Document Released: 05/18/2005 Document Revised: 05/02/2012 [facility] Patient Information 2015 [place]ExitCare, MarylandLLC. This information is not intended to replace advice given to you by your health care provider. Make sure you discuss any questions you have with your health care  provider. Opciones de alimentos para pacientes con lcera pptica (Food Choices for Peptic Ulcer Disease) Cuando se tiene lcera pptica, los alimentos que se ingieren y los hbitos de alimentacin son muy importantes. Elegir los alimentos adecuados puede ayudar a Paramedicaliviar las molestias de la lcera pptica. QU PAUTAS GENERALES DEBO SEGUIR?  Elija las frutas, los vegetales, los cereales integrales, la carne de [place], de pescado y de ave con bajo contenido de grasas.  Lleve un registro de las comidas para identificar los alimentos que ocasionan sntomas.  Evite los alimentos que causen irritacin o Engineer, miningdolor. Pueden ser distintos para cada persona.  Haga comidas pequeas con frecuencia en lugar de tres comidas OfficeMax Incorporatedabundantes todos los das. El dolor puede empeorar cuando el estmago est vaco.  Evite comer poco tiempo antes de Albaacostarse. QU ALIMENTOS NO SE RECOMIENDAN? Los siguientes son algunos alimentos y bebidas que pueden empeorar los sntomas:  Pimienta roja, blanca y negra.  Salsa picante.  Ajes.  Arubahile en polvo.  Chocolate y cacao.  Alcohol.  T, caf y bebidas cola (comunes y sin cafena). Los artculos mencionados arriba pueden no ser Raytheonuna lista completa de las bebidas y los alimentos que se Theatre stage managerdeben evitar. Comunquese con el nutricionista para recibir ms informacin. Document Released: 02/07/2012 Document Revised: 08/13/2013 [facility] DetroitExitCare Patient Information 2015 [place]ExitCare, MarylandLLC. This information is not intended to replace advice given to you by your health care provider. Make sure you discuss any questions you have with your health care provider.

## 2014-06-03 LAB — CERVICOVAGINAL ANCILLARY ONLY
CHLAMYDIA, DNA PROBE: NEGATIVE
Neisseria Gonorrhea: NEGATIVE

## 2014-06-04 LAB — URINE CULTURE
Colony Count: >=100000
SPECIAL REQUESTS: NORMAL

## 2014-06-04 NOTE — ED Notes (Signed)
GC/Chlamydia neg., Affirm: Candida, Gardnerella and Trich all neg., Urine culture: >100,000 colonies E. Coli.  Pt. adequately treated with Keflex. Vassie MoselleYork, Horacio Werth M 06/04/2014

## 2014-06-04 NOTE — Progress Notes (Signed)
Quick Note:  Results are abnormal as noted, but have been adequately treated. No further action necessary. ______ 

## 2014-06-11 ENCOUNTER — Ambulatory Visit: Payer: Self-pay | Attending: Internal Medicine

## 2014-06-23 ENCOUNTER — Encounter (HOSPITAL_COMMUNITY): Payer: Self-pay | Admitting: Emergency Medicine

## 2014-07-01 ENCOUNTER — Encounter (HOSPITAL_COMMUNITY): Payer: Self-pay | Admitting: Emergency Medicine

## 2014-07-01 ENCOUNTER — Emergency Department (INDEPENDENT_AMBULATORY_CARE_PROVIDER_SITE_OTHER)
Admission: EM | Admit: 2014-07-01 | Discharge: 2014-07-01 | Disposition: A | Discharge status: Nursing Home (Includes ICF) | Payer: Self-pay | Source: Home / Self Care

## 2014-07-01 ENCOUNTER — Encounter (HOSPITAL_COMMUNITY): Payer: Self-pay | Admitting: *Deleted

## 2014-07-01 DIAGNOSIS — Z8659 Personal history of other mental and behavioral disorders: Secondary | ICD-10-CM | POA: Insufficient documentation

## 2014-07-01 DIAGNOSIS — Z792 Long term (current) use of antibiotics: Secondary | ICD-10-CM | POA: Insufficient documentation

## 2014-07-01 DIAGNOSIS — Z8679 Personal history of other diseases of the circulatory system: Secondary | ICD-10-CM | POA: Insufficient documentation

## 2014-07-01 DIAGNOSIS — R109 Unspecified abdominal pain: Secondary | ICD-10-CM | POA: Insufficient documentation

## 2014-07-01 DIAGNOSIS — R1031 Right lower quadrant pain: Secondary | ICD-10-CM

## 2014-07-01 DIAGNOSIS — Z79899 Other long term (current) drug therapy: Secondary | ICD-10-CM | POA: Insufficient documentation

## 2014-07-01 DIAGNOSIS — K625 Hemorrhage of anus and rectum: Secondary | ICD-10-CM

## 2014-07-01 DIAGNOSIS — Z7982 Long term (current) use of aspirin: Secondary | ICD-10-CM | POA: Insufficient documentation

## 2014-07-01 DIAGNOSIS — Z87440 Personal history of urinary (tract) infections: Secondary | ICD-10-CM | POA: Insufficient documentation

## 2014-07-01 DIAGNOSIS — Z8742 Personal history of other diseases of the female genital tract: Secondary | ICD-10-CM | POA: Insufficient documentation

## 2014-07-01 HISTORY — DX: Unspecified hemorrhoids: K64.9

## 2014-07-01 LAB — CBC WITH DIFFERENTIAL/PLATELET
BASOS ABS: 0 10*3/uL (ref 0.0–0.1)
BASOS PCT: 0 % (ref 0–1)
Eosinophils Absolute: 0.1 10*3/uL (ref 0.0–0.7)
Eosinophils Relative: 1 % (ref 0–5)
HCT: 39.5 % (ref 36.0–46.0)
Hemoglobin: 13.3 g/dL (ref 12.0–15.0)
LYMPHS PCT: 38 % (ref 12–46)
Lymphs Abs: 4.1 10*3/uL — ABNORMAL HIGH (ref 0.7–4.0)
MCH: 30.2 pg (ref 26.0–34.0)
MCHC: 33.7 g/dL (ref 30.0–36.0)
MCV: 89.8 fL (ref 78.0–100.0)
Monocytes Absolute: 0.5 10*3/uL (ref 0.1–1.0)
Monocytes Relative: 4 % (ref 3–12)
NEUTROS ABS: 6.2 10*3/uL (ref 1.7–7.7)
Neutrophils Relative %: 57 % (ref 43–77)
PLATELETS: 277 10*3/uL (ref 150–400)
RBC: 4.4 MIL/uL (ref 3.87–5.11)
RDW: 12.5 % (ref 11.5–15.5)
WBC: 10.9 10*3/uL — AB (ref 4.0–10.5)

## 2014-07-01 LAB — COMPREHENSIVE METABOLIC PANEL
ALBUMIN: 4.1 g/dL (ref 3.5–5.2)
ALK PHOS: 83 U/L (ref 39–117)
ALT: 24 U/L (ref 0–35)
ANION GAP: 14 (ref 5–15)
AST: 21 U/L (ref 0–37)
BUN: 14 mg/dL (ref 6–23)
CALCIUM: 10.2 mg/dL (ref 8.4–10.5)
CO2: 24 mEq/L (ref 19–32)
CREATININE: 0.51 mg/dL (ref 0.50–1.10)
Chloride: 103 mEq/L (ref 96–112)
GFR calc Af Amer: >90 mL/min (ref >90)
GFR calc non Af Amer: >90 mL/min (ref >90)
Glucose, Bld: 104 mg/dL — ABNORMAL HIGH (ref 70–99)
POTASSIUM: 3.8 meq/L (ref 3.7–5.3)
Sodium: 141 mEq/L (ref 137–147)
TOTAL PROTEIN: 7.8 g/dL (ref 6.0–8.3)
Total Bilirubin: <0.2 mg/dL — ABNORMAL LOW (ref 0.3–1.2)

## 2014-07-01 LAB — TYPE AND SCREEN
ABO/RH(D): O POS
ANTIBODY SCREEN: NEGATIVE

## 2014-07-01 LAB — ABO/RH: ABO/RH(D): O POS

## 2014-07-01 NOTE — ED Notes (Signed)
The patient said she started having bleeding with her bowel movements on saturday but it was minimal.  Today she started having frank bleeding with her bowels movements.  She has had two bloody bowel movements today.  She originally went to urgent care and they sent her here to be evaluated.  She rates her abdominal pain 9/10.

## 2014-07-01 NOTE — ED Notes (Signed)
C/o RLQ abdominal pain and rectal bleeding onset today.  No N or V.   Hx. Hemorrhoids. Bleeding is bright red and occurred after stools.

## 2014-07-01 NOTE — ED Provider Notes (Signed)
Meghan Walker is a 43 y.o. female who presents to Urgent Care today for Abdominal pain and rectal bleeding. Patient has had occasional rectal bleeding off and on previously due to hemorrhoids. However today she developed drops of bright red blood in the toilet after bowel movements. The bleeding has persisted mildly. She denies any melanotic stool. She additionally notes significant abdominal pain starting today. She denies any vomiting or diarrhea. She was treated about a month ago for urinary tract infection.    Past Medical History  Diagnosis Date  . UTI (lower urinary tract infection)   . Depression   . Irregular menstruation    Past Surgical History  Procedure Laterality Date  . Cesarean section    . Appendectomy     History  Substance Use Topics  . Smoking status: Never Smoker   . Smokeless tobacco: Never Used  . Alcohol Use: 0.6 oz/week    1 Cans of beer per week   ROS as above Medications: No current facility-administered medications for this encounter.   Current Outpatient Prescriptions  Medication Sig Dispense Refill  . cephALEXin (KEFLEX) 500 MG capsule Take 1 capsule (500 mg total) by mouth 3 (three) times daily. 30 capsule 0  . hydrocortisone (ANUSOL-HC) 25 MG suppository Place 1 suppository (25 mg total) rectally 2 (two) times daily. 12 suppository 0  . ibuprofen (ADVIL,MOTRIN) 200 MG tablet Take 600 mg by mouth every 6 (six) hours as needed for mild pain.    . metroNIDAZOLE (FLAGYL) 500 MG tablet Take 1 tablet (500 mg total) by mouth 3 (three) times daily. 30 tablet 0  . omeprazole (PRILOSEC) 20 MG capsule Take 1 capsule (20 mg total) by mouth 2 (two) times daily before a meal. 60 capsule 0   No Known Allergies   Exam:  BP 136/69 mmHg  Pulse 89  Temp(Src) 98.7 F (37.1 C) (Oral)  Resp 12  SpO2 100% Gen: Well NAD HEENT: EOMI,  MMM Lungs: Normal work of breathing. CTABL Heart: RRR no MRG Abd: NABS, significantly tender with guarding right lower  quadrant. Exts: Brisk capillary refill, warm and well perfused.   No results found for this or any previous visit (from the past 24 hour(s)). No results found.  Assessment and Plan: 43 y.o. female with abdominal pain with tenderness to palpation with guarding present in the setting of rectal bleeding. I suspect the rectal bleeding is probably due to hemorrhoids however given the patient's significant pain I feel that she needs a further workup.  We'll transfer to the ED via shuttle for evaluation and management.  Discussed warning signs or symptoms. Please see discharge instructions. Patient expresses understanding.     Rodolph BongEvan S Beya Tipps, MD 07/01/14 [phone]

## 2014-07-02 ENCOUNTER — Emergency Department (HOSPITAL_COMMUNITY)
Admission: EM | Admit: 2014-07-02 | Discharge: 2014-07-02 | Disposition: A | Discharge status: Home / Self Care | Payer: Self-pay | Attending: Emergency Medicine | Admitting: Emergency Medicine

## 2014-07-02 ENCOUNTER — Encounter (HOSPITAL_COMMUNITY): Payer: Self-pay | Admitting: Radiology

## 2014-07-02 ENCOUNTER — Emergency Department (HOSPITAL_COMMUNITY): Payer: Self-pay

## 2014-07-02 DIAGNOSIS — R109 Unspecified abdominal pain: Secondary | ICD-10-CM

## 2014-07-02 DIAGNOSIS — K921 Melena: Secondary | ICD-10-CM

## 2014-07-02 HISTORY — DX: Endometriosis, unspecified: N80.9

## 2014-07-02 LAB — URINE MICROSCOPIC-ADD ON

## 2014-07-02 LAB — URINALYSIS, ROUTINE W REFLEX MICROSCOPIC
BILIRUBIN URINE: NEGATIVE
Glucose, UA: NEGATIVE mg/dL
KETONES UR: NEGATIVE mg/dL
LEUKOCYTES UA: NEGATIVE
NITRITE: NEGATIVE
PROTEIN: NEGATIVE mg/dL
Specific Gravity, Urine: <1.005 — ABNORMAL LOW (ref 1.005–1.030)
Urobilinogen, UA: 0.2 mg/dL (ref 0.0–1.0)
pH: 5.5 (ref 5.0–8.0)

## 2014-07-02 LAB — I-STAT BETA HCG BLOOD, ED (MC, WL, AP ONLY): I-stat hCG, quantitative: <5 m[IU]/mL (ref <5)

## 2014-07-02 MED ORDER — HYDROCORTISONE 2.5 % RE CREA: TOPICAL_CREAM | RECTAL | Status: DC

## 2014-07-02 MED ORDER — ONDANSETRON HCL 4 MG/2ML IJ SOLN
4.0000 mg | Freq: Once | INTRAMUSCULAR | Status: DC
  Filled 2014-07-02: qty 2

## 2014-07-02 MED ORDER — MORPHINE SULFATE 4 MG/ML IJ SOLN
4.0000 mg | Freq: Once | INTRAMUSCULAR | Status: AC
  Administered 2014-07-02: 4 mg via INTRAVENOUS
  Filled 2014-07-02: qty 1

## 2014-07-02 MED ORDER — OXYCODONE-ACETAMINOPHEN 5-325 MG PO TABS: 1.0000 | ORAL_TABLET | Freq: Four times a day (QID) | ORAL | Status: DC | PRN

## 2014-07-02 MED ORDER — IOHEXOL 300 MG/ML  SOLN
80.0000 mL | Freq: Once | INTRAMUSCULAR | Status: AC | PRN
  Administered 2014-07-02: 80 mL via INTRAVENOUS

## 2014-07-02 MED ORDER — ONDANSETRON HCL 4 MG/2ML IJ SOLN
4.0000 mg | Freq: Once | INTRAMUSCULAR | Status: AC
  Administered 2014-07-02: 4 mg via INTRAVENOUS
  Filled 2014-07-02: qty 2

## 2014-07-02 MED ORDER — IOHEXOL 300 MG/ML  SOLN: 25.0000 mL | Freq: Once | INTRAMUSCULAR | Status: DC | PRN

## 2014-07-02 MED ORDER — MORPHINE SULFATE 4 MG/ML IJ SOLN
4.0000 mg | Freq: Once | INTRAMUSCULAR | Status: DC
  Filled 2014-07-02: qty 1

## 2014-07-02 MED ORDER — IOHEXOL 300 MG/ML  SOLN
25.0000 mL | Freq: Once | INTRAMUSCULAR | Status: AC | PRN
  Administered 2014-07-02: 25 mL via ORAL

## 2014-07-02 NOTE — Discharge Instructions (Signed)
Dolor abdominal °(Abdominal Pain) °El dolor puede tener muchas causas. Normalmente la causa del dolor abdominal no es una enfermedad y mejorará sin tratamiento. Frecuentemente puede controlarse y tratarse en casa. Su médico le realizará un examen físico y posiblemente solicite análisis de sangre y radiografías para ayudar a determinar la gravedad de su dolor. Sin embargo, en muchos casos, debe transcurrir más tiempo antes de que se pueda encontrar una causa evidente del dolor. Antes de llegar a ese punto, es posible que su médico no sepa si necesita más pruebas o un tratamiento más profundo. °INSTRUCCIONES PARA EL CUIDADO EN EL HOGAR  °Esté atento al dolor para ver si hay cambios. Las siguientes indicaciones ayudarán a aliviar cualquier molestia que pueda sentir: °· Tome solo medicamentos de venta libre o recetados, según las indicaciones del médico. °· No tome laxantes a menos que se lo haya indicado su médico. °· Pruebe con una dieta líquida absoluta (caldo, té o agua) según se lo indique su médico. Introduzca gradualmente una dieta normal, según su tolerancia. °SOLICITE ATENCIÓN MÉDICA SI: °· Tiene dolor abdominal sin explicación. °· Tiene dolor abdominal relacionado con náuseas o diarrea. °· Tiene dolor cuando orina o defeca. °· Experimenta dolor abdominal que lo despierta de noche. °· Tiene dolor abdominal que empeora o mejora cuando come alimentos. °· Tiene dolor abdominal que empeora cuando come alimentos grasosos. °· Tiene fiebre. °SOLICITE ATENCIÓN MÉDICA DE INMEDIATO SI:  °· El dolor no desaparece en un plazo máximo de 2 horas. °· No deja de (vomitar). °· El dolor se siente solo en partes del abdomen, como el lado derecho o la parte inferior izquierda del abdomen. °· Evacúa materia fecal sanguinolenta o negra, de aspecto alquitranado. °ASEGÚRESE DE QUE: °· Comprende estas instrucciones. °· Controlará su afección. °· Recibirá ayuda de inmediato si no mejora o si empeora. °Document Released: 08/08/2005  Document Revised: 08/13/2013 °ExitCare® Patient Information ©2015 ExitCare, LLC. This information is not intended to replace advice given to you by your health care provider. Make sure you discuss any questions you have with your health care provider. ° °Sangre en las heces °(Bloody Stools) °Si se observa sangre en la material fecal (heces) con frecuencia esto significa que el problema se encuentra en el tracto digestivo. El médico puede utilizar el término "melena" para describir heces negras, alquitranadas, de olor fétido o "hamatoquecia" para describir las heces de color rojo o granate. La sangre en las heces puede provenir de cualquier parte del tracto intestinal. °Heces negras generalmente implican que la sangre proviene del la parte superior del tracto gastrointestinal, (esófago, estómago o intestino delgado). Las heces color granate o rojo brillante (hematoquecia) por lo general indican que la sangre proviene de la parte inferior del intestino grueso o del recto. Sin embargo, algunas veces un sangrado masivo en el estómago o en el intestino delgado causa también heces de color rojo brillante.  °La ingestión de regaliz negro, plomo, medicamentos con hierro, medicamentos que contengan bismuto, o el consumo de arándanos también pueden ocasionar heces negras. El profesional que lo asiste puede examinar las heces para descartar la presencia de sangre. °Es importante hallar la causa de la hemorragia rectal. Entonces deberá iniciarse un tratamiento para corregir el problema. Puede que no sea un problema grave. Pero no debe suponer que todo está bien hasta que conozca la causa. Es muy importante el control con su médico o con un especialista en problemas gastrointestinales. °CAUSAS °La sangre en la materia fecal puede provenir de distintas causas subyacentes. Generalmente la causa no se conoce   en la primera visita. Es necesario realizar estudios para descubrir la causa del sangrado en el tracto gastrointestinal. Las  causas pueden ser sencillas o graves, y hasta puede ser algo que ponga en peligro la vida. Las causas posibles son:  °· Hemorroides. Son venas en el recto que están llenas (congestionadas) con sangre. Causan dolor, inflamación y pueden sangrar. °· Fisura anal Son áreas que se han desgarrado, que duelen y pueden sangrar. Generalmente se producen cuando se evacúa materia fecal dura. °· Diverticulosis. Son bolsitas que se forman en el colon con la edad, y pueden sangrar significativamente. °· Diverticulitis. Es una inflamación de las zonas con diverticulosis. Puede causar dolor, fiebre, materia fecal con sangre, aunque las hemorragias son raras. °· Proctitis y colitis. Son zonas inflamadas del recto o el colon. Causan dolor, fiebre y materia fecal con sangre. °· Pólipos y cáncer. El cáncer de colon es la causa principal de muerte por cáncer que puede prevenirse. Generalmente comienza como un pólipo precanceroso que puede extirparse durante una colonoscopía, evitando su progresión hacia el cáncer. En algunos casos, los pólipos y el cáncer pueden causar hemorragia rectal. °· Gastritis y úlceras. Las hemorragias del tracto gastrointestinal superior (cerca del estómago) pueden bajar hacia los intestinos y producir heces de color negro, aspecto alquitranado y de mal olor. En ciertos casos, si la hemorragia es lo suficientemente rápida, la materia fecal no es negra, sino roja, y el problema puede poner en peligro la vida. °SÍNTOMAS °Las heces pueden ser de color rojo y sanguinolentas, de color normal con sangre o de color oscuro y aspecto alquitranado. Puede ser que sólo observe sangre enel inodoro. En cualquiera de estos casos es necesaria la atención médica. También puede presentar: °· Dolor en el recto o en la zona circundante. °· Mareos o desmayo. °· Debilidad extrema. °· Náuseas o vómitos. °· Fiebre. °DIAGNÓSTICO °El médico puede usar los siguientes métodos para hallar la causa de la hemorragia.  °· La historia clínica.  La edad es importante. Las personas mayores tienden a desarrollar pólipos y cáncer con más frecuencia. Si se presenta dolor en la zona anal y heces duras y grandes en relación con el sangrado, la causa puede ser una lesión en el ano. Si la sangre gotea en el inodoro luego de mover el intestino, el problema puede ser debido a hemorroides que sangran. El color y la frecuencia del sangrado son consideraciones adicionales. En la mayoría de los casos, la historia clínica proporciona pistas, pero rara vez da la última respuesta. °· Examen visual y con los dedos (digital). El médico inspeccionará la zona anal en búsqueda de lesiones y hemorroides. El examen digital puede brindar información cuando hay molestias o un abultamiento en el interior. En los hombres, también se examina la próstata. °· Endoscopia. Hay varios tipos de endoscopios (pequeños y de largo alcance) utilizados para visualizar el colon. °¨ En el consultorio, el profesional que lo asiste puede usar un sigmoideoscopio rígido, o con más frecuencia uno flexible. Este examen se denomina sigmoideoscopía flexible El tiempo de realización es de entre 5 a 10 minutos °¨ Un examen más minucioso se realiza con un colonoscopio Este le permite al profesional visualizar todo el largo del colon de entre 1,40 m y 1,80 m En general, para practicar este examen se hace dormir al paciente (sedación). Con frecuencia, una lesión sangrante puede estar presente más allá del alcance del sigmoidoscopio. En ese caso lo mejor es comenzar con una colonoscopia. Ambos exámenes generalmente se realizan por consultorios externos. Esto significa que el paciente no permanece toda la   noche en el hospital o en el centro quirúrgico. °¨ Podrá ser necesaria la realización de una endoscopía digestiva alta para examinar el estómago. Luego de la sedación se inserta un endoscopio flexible en la boca y se lo hace descender hasta el estómago. °· Colon por enema. Es un tipo de radiografia. Se utiliza  bario líquido que se introduce en el recto por medio de una enema. Esta prueba por sí misma no identifica un punto de sangrado. Los rayos x destacan sombras anormales como bultos (tumores), divertículos, o colitis. °TRATAMIENTO °El tratamiento depende de la causa de la hemorragia.  °· Para las hemorragias que tienen su origen en la zona que se encuentra entre el estómago y el colon, el especialista podrá detener la hemorragia al realizar la endoscopía o la colonoscopia, como parte del procedimiento. °· La inflamación o infección del colon puede tratarse con medicamentos. °· Muchos problemas rectales se tratan con cremas, supositorios o baños calientes. °· A menudo se requiere cirugía. °· Si hubiera perdido mucha sangre, será necesario que se someta a una transfusión sanguínea. °· En cualquier problema de hemorragias, hága saber a su médico si está tomando aspirinas o cualquier anticoagulante de manera regular. °INSTRUCCIONES PARA EL CUIDADO DOMICILIARIO °· Utilice todos los medicamentos tal como se le indicó. °· Evite el endurecimiento de las heces, consumiendo una dieta alta en fibras. Para muchas personas es de gran ayuda consumir ciruelas (una o dos por día). °· Beba gran cantidad de líquido para mantener la orina de tono claro o color amarillo pálido. °· Si se lo indican, tome baños de asiento. El baño de asiento se realiza sentándose en la bañera, llena de agua caliente, durante 10 a 15 minutos para remojar, suavizar y limpiar la zona rectal. °· Si le indicaron enemas o supositorios, asegúrese que comprende como utilizarlos. Comuníquele a su médico si tiene algún problema. °· Controle sus movimientos intestinales para observar si existen señales de mejoría (o empeora). °SOLICITE ANTENCIÓN MÉDICA SI: °· No obtiene mejoría en el tiempo esperado. °· Su problema empeora luego de una mejoría inicial. °· Desarrolla nuevos síntomas. °SOLICITE ATENCIÓN MÉDICA DE INMEDIATO SI: °· Desarrolla una hemorragia rectal intensa  o prolongada. °· Vomita sangre. °· Se siente mareado o sufre un desmayo. °· Tiene fiebre. °ASEGÚRESE DE QUE:  °· Comprende estas instrucciones. °· Controlará su enfermedad. °· Solicitará ayuda de inmediato si no mejora o si empeora. °Document Released: 08/08/2005 Document Revised: 10/31/2011 °ExitCare® Patient Information ©2015 ExitCare, LLC. This information is not intended to replace advice given to you by your health care provider. Make sure you discuss any questions you have with your health care provider. ° °

## 2014-07-02 NOTE — ED Notes (Signed)
Patient is alert and orientedx4.  Patient was explained discharge instructions and they understood them with no questions.   

## 2014-07-02 NOTE — ED Notes (Signed)
Hemoccult positive, Dr. Wilkie AyeHorton notified.

## 2014-07-02 NOTE — ED Provider Notes (Signed)
CSN: [id number]     Arrival date & time 07/01/14  2031 History   First MD Initiated Contact with Patient 07/02/14 0024     Chief Complaint  Patient presents with  . GI Bleeding    The patient said she started having bleeding with her bowel movements on saturday but it was minimal.  Today she started having frank bleeding with her bowels movements.  She has had two bloody bowel movements today.  . Abdominal Pain     (Consider location/radiation/quality/duration/timing/severity/associated sxs/prior Treatment) Patient is a 43 y.o. female presenting with abdominal pain.  Abdominal Pain Associated symptoms: no chest pain, no constipation, no cough, no diarrhea, no dysuria, no fever, no nausea, no shortness of breath and no vomiting     This is a 43 year old female with history of hemorrhoids and endometriosis who presents with rectal bleeding. Patient was seen and evaluated earlier today at urgent care. Patient reports that she has had intermittent and ongoing bloody stools. She was evaluated approximately one month ago for the same by her primary care physician. She was told that she had hemorrhoids and was given "cream." She also was told at that time that she had a urinary tract infection. Patient reports that her rectal bleeding has gotten "worse." He reports blood streaked on the stool as well as on her toilet paper. She denies any hard stools. She does report pain with defecation. Patient also reports diffuse abdominal pain. She denies any nausea or vomiting. She denies any urinary symptoms or fevers.  Current pain is 9 out of 10.  History obtained with the help of a language interpreter.  Past Medical History  Diagnosis Date  . UTI (lower urinary tract infection)   . Depression   . Irregular menstruation   . Hemorrhoids   . Endometriosis    Past Surgical History  Procedure Laterality Date  . Cesarean section    . Appendectomy     Family History  Problem Relation Age of Onset  .  Heart murmur Mother   . Ulcers Mother   . Hypertension Father   . Kidney disease Father   . Ulcers Father   . Stroke Maternal Grandmother    History  Substance Use Topics  . Smoking status: Never Smoker   . Smokeless tobacco: Never Used  . Alcohol Use: 0.6 oz/week    1 Cans of beer per week   OB History    Gravida Para Term Preterm AB TAB SAB Ectopic Multiple Living   3 3 3  0 0 0 0 0 0 3     Review of Systems  Constitutional: Negative for fever.  Respiratory: Negative for cough, chest tightness and shortness of breath.   Cardiovascular: Negative for chest pain.  Gastrointestinal: Positive for abdominal pain and blood in stool. Negative for nausea, vomiting, diarrhea and constipation.  Genitourinary: Negative for dysuria and flank pain.  Musculoskeletal: Negative for back pain.  Neurological: Negative for headaches.  All other systems reviewed and are negative.     Allergies  Review of patient's allergies indicates no known allergies.  Home Medications   Prior to Admission medications   Medication Sig Start Date End Date Taking? Authorizing Provider  cephALEXin (KEFLEX) 500 MG capsule Take 1 capsule (500 mg total) by mouth 3 (three) times daily. 06/02/14   Reuben Likesavid C Keller, MD  hydrocortisone (ANUSOL-HC) 2.5 % rectal cream Apply rectally 2 times daily 07/02/14   Shon Batonourtney F Ashli Selders, MD  hydrocortisone (ANUSOL-HC) 25 MG suppository Place 1 suppository (  25 mg total) rectally 2 (two) times daily. 05/12/14   Doris Cheadle, MD  ibuprofen (ADVIL,MOTRIN) 200 MG tablet Take 600 mg by mouth every 6 (six) hours as needed for mild pain.    Historical Provider, MD  metroNIDAZOLE (FLAGYL) 500 MG tablet Take 1 tablet (500 mg total) by mouth 3 (three) times daily. 06/02/14   Reuben Likes, MD  omeprazole (PRILOSEC) 20 MG capsule Take 1 capsule (20 mg total) by mouth 2 (two) times daily before a meal. 06/02/14   Reuben Likes, MD  oxyCODONE-acetaminophen (PERCOCET/ROXICET) 5-325 MG per  tablet Take 1 tablet by mouth every 6 (six) hours as needed for moderate pain or severe pain. 07/02/14   Shon Baton, MD   BP 104/58 mmHg  Pulse 63  Temp(Src) 98.1 F (36.7 C) (Oral)  Resp 15  SpO2 97%  LMP 06/23/2014 Physical Exam  Constitutional: She is oriented to person, place, and time. She appears well-developed and well-nourished. No distress.  HENT:  Head: Normocephalic and atraumatic.  Eyes: Pupils are equal, round, and reactive to light.  Cardiovascular: Normal rate, regular rhythm and normal heart sounds.   No murmur heard. Pulmonary/Chest: Effort normal and breath sounds normal. No respiratory distress. She has no wheezes.  Abdominal: Soft. Bowel sounds are normal. There is no tenderness. There is no rebound and no guarding.  Genitourinary:  No external hemorrhoids noted, tenderness with digital rectal exam, no obvious fissure, no gross blood  Neurological: She is alert and oriented to person, place, and time.  Skin: Skin is warm and dry.  Psychiatric: She has a normal mood and affect.  Nursing note and vitals reviewed.   ED Course  Procedures (including critical care time) Labs Review Labs Reviewed  COMPREHENSIVE METABOLIC PANEL - Abnormal; Notable for the following:    Glucose, Bld 104 (*)    Total Bilirubin <0.2 (*)    All other components within normal limits  CBC WITH DIFFERENTIAL - Abnormal; Notable for the following:    WBC 10.9 (*)    Lymphs Abs 4.1 (*)    All other components within normal limits  URINALYSIS, ROUTINE W REFLEX MICROSCOPIC - Abnormal; Notable for the following:    Specific Gravity, Urine <1.005 (*)    Hgb urine dipstick MODERATE (*)    All other components within normal limits  URINE MICROSCOPIC-ADD ON - Abnormal; Notable for the following:    Squamous Epithelial / LPF FEW (*)    All other components within normal limits  POC OCCULT BLOOD, ED  POC OCCULT BLOOD, ED  I-STAT BETA HCG BLOOD, ED ([facility], [facility], [facility] ONLY)  TYPE AND SCREEN   ABO/RH    Imaging Review Ct Abdomen Pelvis W Contrast  07/02/2014   CLINICAL DATA:  Rectal bleeding. Diffuse abdominal pain. Symptoms began 07/01/2014. Initial encounter.  EXAM: CT ABDOMEN AND PELVIS WITH CONTRAST  TECHNIQUE: Multidetector CT imaging of the abdomen and pelvis was performed using the standard protocol following bolus administration of intravenous contrast.  CONTRAST:  80 mL OMNIPAQUE IOHEXOL 300 MG/ML  SOLN  COMPARISON:  None.  FINDINGS: The lung bases are clear.  No pleural or pericardial effusion.  The gallbladder, liver, spleen, adrenal glands, pancreas, biliary tree and kidneys all appear normal. Uterus, adnexa and urinary bladder are unremarkable. The patient is status post appendectomy. The stomach and small and large bowel appear normal. There is no lymphadenopathy or fluid. No focal bony abnormality is identified.  IMPRESSION: Negative CT abdomen and pelvis. No finding to explain  the patient's symptoms.   Electronically Signed   By: Drusilla Kannerhomas  Dalessio M.D.   On: 07/02/2014 03:12     EKG Interpretation None      MDM   Final diagnoses:  Abdominal pain  Bloody stool    Patient presents with abdominal pain and bloody stools. She is nontoxic on exam.  Vital signs are reassuring. No significant tenderness to palpation on exam. No obvious external hemorrhoids;  However, patient does have tenderness with digital rectal exam. No obvious fissure or other source of pain. Hemoccult positive. Given abdominal pain and no obvious hemorrhoids, considerations include inflammatory bowel. Abdomen is relatively nontender.  Basic labwork obtained and reassuring including a hematocrit of 39.5 which is stable from baseline. Mild leukocytosis to 10.9. Urinalysis without evidence of infection. We'll obtain CT scan to evaluate for IBD. This will also evaluate for other acute abdominal pathologies including appendicitis. CT scan negative.  Results discussed with patient. Will give patient GI  follow-up. She was given Anusol for possible internal hemorrhoids as well as Percocet for pain. Patient does have a history of endometriosis. This could be the cause of her abdominal pain. Patient was given strict return precautions.  After history, exam, and medical workup I feel the patient has been appropriately medically screened and is safe for discharge home. Pertinent diagnoses were discussed with the patient. Patient was given return precautions.     Shon Batonourtney F Toriana Sponsel, MD 07/02/14 [phone]

## 2014-07-08 ENCOUNTER — Encounter: Payer: Self-pay | Admitting: Gastroenterology

## 2014-07-08 ENCOUNTER — Ambulatory Visit (INDEPENDENT_AMBULATORY_CARE_PROVIDER_SITE_OTHER): Payer: Self-pay | Admitting: Gastroenterology

## 2014-07-08 VITALS — BP 122/68 | HR 64 | Ht <= 58 in | Wt 135.2 lb

## 2014-07-08 DIAGNOSIS — R109 Unspecified abdominal pain: Secondary | ICD-10-CM | POA: Insufficient documentation

## 2014-07-08 DIAGNOSIS — K625 Hemorrhage of anus and rectum: Secondary | ICD-10-CM | POA: Insufficient documentation

## 2014-07-08 MED ORDER — DICYCLOMINE HCL 10 MG PO CAPS: 10.0000 mg | ORAL_CAPSULE | Freq: Two times a day (BID) | ORAL | Status: DC

## 2014-07-08 MED ORDER — MOVIPREP 100 G PO SOLR: 1.0000 | Freq: Once | ORAL | Status: DC

## 2014-07-08 NOTE — Progress Notes (Signed)
07/08/2014 Meghan Walker [id number] 21-Nov-1970   HISTORY OF PRESENT ILLNESS:  This is a 43 year old female who is primarily Spanish-speaking.  She presents to our office today with an interpreter as a new patient with referral from the ED for evaluation of her rectal bleeding and abdominal pain.  All information was obtained through the interpreter.  She says that the abdominal pain has been present for the past several years.  It is constant pain and is diffuse, but mostly lower abdomen.  She says that the rectal bleeding has been present on and off for the past year but has been much more recently and occurring daily; says that she never saw anything like it before, which is why she went to the ED.  CT scan of the abdomen and pelvis with contrast on 07/02/2014 was normal.  As was CBC and CMP.  She was given suppositories and creams for hemorrhoids, which she has been using twice a day.  Says that the bleeding has stopped so far.  She denies diarrhea.  Admits to occasional constipation, but not frequent.     Past Medical History  Diagnosis Date  . UTI (lower urinary tract infection)   . Depression   . Irregular menstruation   . Hemorrhoids   . Endometriosis    Past Surgical History  Procedure Laterality Date  . Cesarean section      X 3  . Appendectomy      reports that she has never smoked. She has never used smokeless tobacco. She reports that she drinks alcohol. She reports that she does not use illicit drugs. family history includes Heart murmur in her mother; Hypertension in her father; Kidney disease in her father; Stroke in her maternal grandmother; Ulcers in her father and mother. No Known Allergies    Outpatient Encounter Prescriptions as of 07/08/2014  Medication Sig  . cephALEXin (KEFLEX) 500 MG capsule Take 1 capsule (500 mg total) by mouth 3 (three) times daily.  . hydrocortisone (ANUSOL-HC) 2.5 % rectal cream Apply rectally 2 times daily  .  hydrocortisone (ANUSOL-HC) 25 MG suppository Place 1 suppository (25 mg total) rectally 2 (two) times daily.  Marland Kitchen omeprazole (PRILOSEC) 20 MG capsule Take 1 capsule (20 mg total) by mouth 2 (two) times daily before a meal.  . oxyCODONE-acetaminophen (PERCOCET/ROXICET) 5-325 MG per tablet Take 1 tablet by mouth every 6 (six) hours as needed for moderate pain or severe pain.  Marland Kitchen dicyclomine (BENTYL) 10 MG capsule Take 1 capsule (10 mg total) by mouth 2 (two) times daily.  . hydrocortisone-pramoxine (ANALPRAM-HC) 2.5-1 % rectal cream   . MOVIPREP 100 G SOLR Take 1 kit (200 g total) by mouth once.  . [DISCONTINUED] ibuprofen (ADVIL,MOTRIN) 200 MG tablet Take 600 mg by mouth every 6 (six) hours as needed for mild pain.  . [DISCONTINUED] metroNIDAZOLE (FLAGYL) 500 MG tablet Take 1 tablet (500 mg total) by mouth 3 (three) times daily.     REVIEW OF SYSTEMS  : All other systems reviewed and negative except where noted in the History of Present Illness.   PHYSICAL EXAM: BP 122/68 mmHg  Pulse 64  Ht _0  (1.422 m)  Wt 135 lb 4 oz (61.349 kg)  BMI 30.34 kg/m2  LMP 06/20/2014 General: Well developed female in no acute distress Head: Normocephalic and atraumatic Eyes:  Sclerae anicteric, conjunctiva pink. Ears: Normal auditory acuity Lungs: Clear throughout to auscultation Heart: Regular rate and rhythm Abdomen: Soft, non-distended.  Normal bowel  sounds.  Diffuse TTP without R/R/G. Musculoskeletal: Symmetrical with no gross deformities  Skin: No lesions on visible extremities Extremities: No edema  Neurological: Alert oriented x 4, grossly non-focal Psychological:  Alert and cooperative. Normal mood and affect  ASSESSMENT AND PLAN: -Rectal bleeding and abdominal pain:  Bleeding is likely secondary to hemorrhoids since it has improved with topical hemorrhoid treatment and abdominal pain is likely functional/IBS due to the fact that it has been present for so long with negative CT scan.  Due to the  duration and severity of her symptoms, especially the bleeding, we will complete her GI evaluation and schedule colonoscopy to rule out IBD, etc.  She will continue topical hemorrhoid treatment in the interim and we will try Bentyl 10 mg BID for the abdominal pain as well.  *The risks, benefits, and alternatives were discussed with the patient and she consents to proceed.

## 2014-07-08 NOTE — Progress Notes (Signed)
i agree with the above note, plan 

## 2014-07-08 NOTE — Patient Instructions (Addendum)
You have been scheduled for a colonoscopy. Please follow written instructions given to you at your visit today.  Please pick up your prep kit at the pharmacy within the next 1-3 days. If you use inhalers (even only as needed), please bring them with you on the day of your procedure.  Continue your creams and suppositories that where given to you in the emergency room   Bentyl 10 mg was sent to your pharmacy, please take one tablet by mouth twice daily

## 2014-07-10 ENCOUNTER — Telehealth: Payer: Self-pay | Admitting: Gastroenterology

## 2014-07-10 NOTE — Telephone Encounter (Signed)
Called patient back Patients son had to translate for patient  Patient will come here tomorrow to pick up movi prep sample

## 2014-08-29 ENCOUNTER — Ambulatory Visit (AMBULATORY_SURGERY_CENTER): Payer: Self-pay | Admitting: Gastroenterology

## 2014-08-29 ENCOUNTER — Encounter: Payer: Self-pay | Admitting: Gastroenterology

## 2014-08-29 VITALS — BP 167/80 | HR 85 | Temp 97.8°F | Resp 18 | Ht <= 58 in | Wt 135.0 lb

## 2014-08-29 DIAGNOSIS — K625 Hemorrhage of anus and rectum: Secondary | ICD-10-CM

## 2014-08-29 MED ORDER — SODIUM CHLORIDE 0.9 % IV SOLN: 500.0000 mL | INTRAVENOUS | Status: DC

## 2014-08-29 NOTE — Op Note (Signed)
[facility] [address] [place], [id number]   COLONOSCOPY PROCEDURE REPORT  PATIENT: Meghan SellsSanchez-gonzalez, Marquette D  MR#: [id number] BIRTHDATE: 04-22-1971 , 43  yrs. old GENDER: female ENDOSCOPIST: Rachael Feeaniel P Keijuan Schellhase, MD REFERRED QI:ONGEXBBY:Deepak Advani, MD PROCEDURE DATE:  08/29/2014 PROCEDURE:   Colonoscopy, diagnostic First Screening Colonoscopy - Avg.  risk and is 50 yrs.  old or older - No.  Prior Negative Screening - Now for repeat screening. N/A  History of Adenoma - Now for follow-up colonoscopy & has been > or = to 3 yrs.  N/A  Polyps Removed Today? No.  Recommend repeat exam, <10 yrs? No. ASA CLASS:   Class II INDICATIONS:Intermittent mild rectal bleeding, mild intermittent abd pain. MEDICATIONS: Monitored anesthesia care, Propofol 200 mg IV, and lidocaine 100mg  IV  DESCRIPTION OF PROCEDURE:   After the risks benefits and alternatives of the procedure were thoroughly explained, informed consent was obtained.  The digital rectal exam revealed no abnormalities of the rectum.   The LB [id number]  endoscope was introduced through the anus and advanced to the cecum, which was identified by both the appendix and ileocecal valve. No adverse events experienced.   The quality of the prep was good.  The instrument was then slowly withdrawn as the colon was fully examined.  COLON FINDINGS: A normal appearing cecum, ileocecal valve, and appendiceal orifice were identified.  The ascending, transverse, descending, sigmoid colon, and rectum appeared unremarkable. Retroflexed views revealed no abnormalities. The time to cecum=1 minutes 02 seconds.  Withdrawal time=11 minutes 04 seconds.  The scope was withdrawn and the procedure completed. COMPLICATIONS: There were no immediate complications.  ENDOSCOPIC IMPRESSION: Normal colonoscopy No polyps or cancers  RECOMMENDATIONS: You should continue to follow colorectal cancer screening guidelines for "routine risk"  patients with a repeat colonoscopy in 10 years. Please continue the antispasm medicines as needed.  eSigned:  Rachael Feeaniel P Nehemiah Mcfarren, MD 08/29/2014 3:49 PM

## 2014-08-29 NOTE — Progress Notes (Signed)
Report to PACU, RN, vss, BBS= Clear.  

## 2014-08-29 NOTE — Patient Instructions (Signed)
Discharge instructions given. Normal exam. Resume previous medications. YOU HAD AN ENDOSCOPIC PROCEDURE TODAY AT THE [facility]: Refer to the procedure report that was given to you for any specific questions about what was found during the examination.  If the procedure report does not answer your questions, please call your gastroenterologist to clarify.  If you requested that your care partner not be given the details of your procedure findings, then the procedure report has been included in a sealed envelope for you to review at your convenience later.  YOU SHOULD EXPECT: Some feelings of bloating in the abdomen. Passage of more gas than usual.  Walking can help get rid of the air that was put into your GI tract during the procedure and reduce the bloating. If you had a lower endoscopy (such as a colonoscopy or flexible sigmoidoscopy) you may notice spotting of blood in your stool or on the toilet paper. If you underwent a bowel prep for your procedure, then you may not have a normal bowel movement for a few days.  DIET: Your first meal following the procedure should be a light meal and then it is ok to progress to your normal diet.  A half-sandwich or bowl of soup is an example of a good first meal.  Heavy or fried foods are harder to digest and may make you feel nauseous or bloated.  Likewise meals heavy in dairy and vegetables can cause extra gas to form and this can also increase the bloating.  Drink plenty of fluids but you should avoid alcoholic beverages for 24 hours.  ACTIVITY: Your care partner should take you home directly after the procedure.  You should plan to take it easy, moving slowly for the rest of the day.  You can resume normal activity the day after the procedure however you should NOT DRIVE or use heavy machinery for 24 hours (because of the sedation medicines used during the test).    SYMPTOMS TO REPORT IMMEDIATELY: A gastroenterologist can be reached at any hour.   During normal business hours, 8:30 AM to 5:00 PM Monday through Friday, call [phone].  After hours and on weekends, please call the GI answering service at [phone] who will take a message and have the physician on call contact you.   Following lower endoscopy (colonoscopy or flexible sigmoidoscopy):  Excessive amounts of blood in the stool  Significant tenderness or worsening of abdominal pains  Swelling of the abdomen that is new, acute  Fever of 100F or higher  FOLLOW UP: If any biopsies were taken you will be contacted by phone or by letter within the next 1-3 weeks.  Call your gastroenterologist if you have not heard about the biopsies in 3 weeks.  Our staff will call the home number listed on your records the next business day following your procedure to check on you and address any questions or concerns that you may have at that time regarding the information given to you following your procedure. This is a courtesy call and so if there is no answer at the home number and we have not heard from you through the emergency physician on call, we will assume that you have returned to your regular daily activities without incident.  SIGNATURES/CONFIDENTIALITY: You and/or your care partner have signed paperwork which will be entered into your electronic medical record.  These signatures attest to the fact that that the information above on your After Visit Summary has been reviewed and is understood.    Full responsibility of the confidentiality of this discharge information lies with you and/or your care-partner. 

## 2014-09-01 ENCOUNTER — Telehealth: Payer: Self-pay

## 2014-09-01 NOTE — Telephone Encounter (Signed)
Left a message at [phone] for the pt to call us back if any questions or concerns. maw

## 2014-09-15 ENCOUNTER — Ambulatory Visit: Payer: Self-pay | Attending: Internal Medicine | Admitting: Internal Medicine

## 2014-09-15 ENCOUNTER — Encounter: Payer: Self-pay | Admitting: Internal Medicine

## 2014-09-15 VITALS — BP 131/83 | HR 79 | Temp 98.1°F | Resp 16

## 2014-09-15 DIAGNOSIS — R059 Cough, unspecified: Secondary | ICD-10-CM

## 2014-09-15 DIAGNOSIS — N9489 Other specified conditions associated with female genital organs and menstrual cycle: Secondary | ICD-10-CM | POA: Insufficient documentation

## 2014-09-15 DIAGNOSIS — R05 Cough: Secondary | ICD-10-CM | POA: Insufficient documentation

## 2014-09-15 DIAGNOSIS — J069 Acute upper respiratory infection, unspecified: Secondary | ICD-10-CM | POA: Insufficient documentation

## 2014-09-15 DIAGNOSIS — N898 Other specified noninflammatory disorders of vagina: Secondary | ICD-10-CM

## 2014-09-15 LAB — POCT URINALYSIS DIPSTICK
BILIRUBIN UA: NEGATIVE
Glucose, UA: NEGATIVE
KETONES UA: NEGATIVE
Leukocytes, UA: NEGATIVE
Nitrite, UA: NEGATIVE
Spec Grav, UA: 1.02
UROBILINOGEN UA: 0.2
pH, UA: 7

## 2014-09-15 MED ORDER — BENZONATATE 100 MG PO CAPS: 100.0000 mg | ORAL_CAPSULE | Freq: Three times a day (TID) | ORAL | Status: DC | PRN

## 2014-09-15 MED ORDER — AZITHROMYCIN 250 MG PO TABS: ORAL_TABLET | ORAL | Status: DC

## 2014-09-15 MED ORDER — FLUCONAZOLE 150 MG PO TABS: 150.0000 mg | ORAL_TABLET | Freq: Once | ORAL | Status: DC

## 2014-09-15 NOTE — Progress Notes (Signed)
MRN: [id number] Name: Meghan SellsMaria D Walker  Sex: female Age: 44 y.o. DOB: 1971-07-25  Allergies: Review of patient's allergies indicates no known allergies.  Chief Complaint  Patient presents with  . vaginal odor    HPI: Patient is 44 y.o. female who comes today complaining of vaginal odor  and foul-smelling urinerecently for flu days has some itching denies any dysuria, she does have history of UTI in the past, denies any fever chills nausea vomiting, patient does have chronic lower abdominal pain denies any change in bowel habits, had a CAT scan done in November which was negative, recently patient is having URI symptoms stuffy nose runny nose postnasal drip minimal productive cough bilateral ear pain/fullness. Patient has tried over-the-counter medication without much improvement.  Past Medical History  Diagnosis Date  . UTI (lower urinary tract infection)   . Depression   . Irregular menstruation   . Hemorrhoids   . Endometriosis   . Anxiety     Past Surgical History  Procedure Laterality Date  . Cesarean section      X 3  . Appendectomy        Medication List       This list is accurate as of: 09/15/14  2:25 PM.  Always use your most recent med list.               azithromycin 250 MG tablet  Commonly known as:  ZITHROMAX Z-PAK  Take as directed     benzonatate 100 MG capsule  Commonly known as:  TESSALON  Take 1 capsule (100 mg total) by mouth 3 (three) times daily as needed for cough.     cephALEXin 500 MG capsule  Commonly known as:  KEFLEX  Take 1 capsule (500 mg total) by mouth 3 (three) times daily.     dicyclomine 10 MG capsule  Commonly known as:  BENTYL  Take 1 capsule (10 mg total) by mouth 2 (two) times daily.     fluconazole 150 MG tablet  Commonly known as:  DIFLUCAN  Take 1 tablet (150 mg total) by mouth once.     hydrocortisone 25 MG suppository  Commonly known as:  ANUSOL-HC  Place 1 suppository (25 mg total) rectally 2 (two)  times daily.     hydrocortisone-pramoxine 2.5-1 % rectal cream  Commonly known as:  ANALPRAM-HC     omeprazole 20 MG capsule  Commonly known as:  PRILOSEC  Take 1 capsule (20 mg total) by mouth 2 (two) times daily before a meal.     oxyCODONE-acetaminophen 5-325 MG per tablet  Commonly known as:  PERCOCET/ROXICET  Take 1 tablet by mouth every 6 (six) hours as needed for moderate pain or severe pain.        Meds ordered this encounter  Medications  . fluconazole (DIFLUCAN) 150 MG tablet    Sig: Take 1 tablet (150 mg total) by mouth once.    Dispense:  1 tablet    Refill:  0  . azithromycin (ZITHROMAX Z-PAK) 250 MG tablet    Sig: Take as directed    Dispense:  6 each    Refill:  0  . benzonatate (TESSALON) 100 MG capsule    Sig: Take 1 capsule (100 mg total) by mouth 3 (three) times daily as needed for cough.    Dispense:  30 capsule    Refill:  1     There is no immunization history on file for this patient.  Family History  Problem Relation  Age of Onset  . Heart murmur Mother   . Ulcers Mother   . Hypertension Father   . Kidney disease Father   . Ulcers Father   . Stroke Maternal Grandmother     History  Substance Use Topics  . Smoking status: Never Smoker   . Smokeless tobacco: Never Used  . Alcohol Use: No    Review of Systems   As noted in HPI  Filed Vitals:   09/15/14 1407  BP: 131/83  Pulse: 79  Temp: 98.1 F (36.7 C)  Resp: 16    Physical Exam  Physical Exam  Constitutional: No distress.  HENT:  Nasal congestion minimal sinus tenderness, both TMs visualized minimal congestion on the right   Eyes: EOM are normal. Pupils are equal, round, and reactive to light.  Cardiovascular: Normal rate and regular rhythm.   Pulmonary/Chest: Breath sounds normal. No respiratory distress. She has no wheezes. She has no rales.  Abdominal:  No CVA tenderness, minimal tenderness lower abdomen with deep palpation no rebound or guarding     CBC      Component Value Date/Time   WBC 10.9* 07/01/2014 2126   RBC 4.40 07/01/2014 2126   HGB 13.3 07/01/2014 2126   HCT 39.5 07/01/2014 2126   PLT 277 07/01/2014 2126   MCV 89.8 07/01/2014 2126   LYMPHSABS 4.1* 07/01/2014 2126   MONOABS 0.5 07/01/2014 2126   EOSABS 0.1 07/01/2014 2126   BASOSABS 0.0 07/01/2014 2126    CMP     Component Value Date/Time   NA 141 07/01/2014 2126   K 3.8 07/01/2014 2126   CL 103 07/01/2014 2126   CO2 24 07/01/2014 2126   GLUCOSE 104* 07/01/2014 2126   BUN 14 07/01/2014 2126   CREATININE 0.51 07/01/2014 2126   CREATININE 0.57 05/12/2014 1538   CALCIUM 10.2 07/01/2014 2126   PROT 7.8 07/01/2014 2126   ALBUMIN 4.1 07/01/2014 2126   AST 21 07/01/2014 2126   ALT 24 07/01/2014 2126   ALKPHOS 83 07/01/2014 2126   BILITOT <0.2* 07/01/2014 2126   GFRNONAA >90 07/01/2014 2126   GFRNONAA >89 05/12/2014 1538   GFRAA >90 07/01/2014 2126   GFRAA >89 05/12/2014 1538    Lab Results  Component Value Date/Time   CHOL 156 05/12/2014 03:38 PM    No components found for: HGA1C  Lab Results  Component Value Date/Time   AST 21 07/01/2014 09:26 PM    Assessment and Plan  Vaginal odor - Plan:  Results for orders placed or performed in visit on 09/15/14  Urinalysis Dipstick  Result Value Ref Range   Color, UA yellow    Clarity, UA clear    Glucose, UA neg    Bilirubin, UA neg    Ketones, UA neg    Spec Grav, UA 1.020    Blood, UA large    pH, UA 7.0    Protein, UA     Urobilinogen, UA 0.2    Nitrite, UA neg    Leukocytes, UA Negative     Urinalysis Dipstick negative for LE or nitrites, blood positive likely secondary to patient currently having menstrual periods , will treat for yeast infection fluconazole (DIFLUCAN) 150 MG tablet  URI (upper respiratory infection) - Plan: azithromycin (ZITHROMAX Z-PAK) 250 MG tablet  Cough - Plan: benzonatate (TESSALON) 100 MG capsule Also advise patient for saltwater gargles.   Doris Cheadle,  MD

## 2014-09-15 NOTE — Progress Notes (Signed)
Patient here with interpreter Complains of pain when she voids and also having an odor Denies any discharge Patient also complains of cough and pain to her right ear Symptoms started about one week ago

## 2014-10-07 ENCOUNTER — Encounter: Payer: Self-pay | Admitting: Internal Medicine

## 2014-10-07 ENCOUNTER — Ambulatory Visit (HOSPITAL_COMMUNITY)
Admission: RE | Admit: 2014-10-07 | Discharge: 2014-10-07 | Disposition: A | Discharge status: Home / Self Care | Payer: Self-pay | Source: Ambulatory Visit | Attending: Internal Medicine | Admitting: Internal Medicine

## 2014-10-07 ENCOUNTER — Ambulatory Visit: Payer: Self-pay | Attending: Internal Medicine | Admitting: Internal Medicine

## 2014-10-07 VITALS — BP 124/79 | HR 77 | Temp 98.0°F | Resp 16 | Wt 138.2 lb

## 2014-10-07 DIAGNOSIS — R0981 Nasal congestion: Secondary | ICD-10-CM

## 2014-10-07 DIAGNOSIS — R05 Cough: Secondary | ICD-10-CM

## 2014-10-07 DIAGNOSIS — Z79899 Other long term (current) drug therapy: Secondary | ICD-10-CM | POA: Insufficient documentation

## 2014-10-07 DIAGNOSIS — R059 Cough, unspecified: Secondary | ICD-10-CM

## 2014-10-07 DIAGNOSIS — R0989 Other specified symptoms and signs involving the circulatory and respiratory systems: Secondary | ICD-10-CM | POA: Insufficient documentation

## 2014-10-07 DIAGNOSIS — Z7951 Long term (current) use of inhaled steroids: Secondary | ICD-10-CM | POA: Insufficient documentation

## 2014-10-07 DIAGNOSIS — N390 Urinary tract infection, site not specified: Secondary | ICD-10-CM

## 2014-10-07 DIAGNOSIS — N949 Unspecified condition associated with female genital organs and menstrual cycle: Secondary | ICD-10-CM

## 2014-10-07 DIAGNOSIS — Z792 Long term (current) use of antibiotics: Secondary | ICD-10-CM | POA: Insufficient documentation

## 2014-10-07 LAB — POCT URINALYSIS DIPSTICK
Bilirubin, UA: NEGATIVE
Glucose, UA: NEGATIVE
Ketones, UA: NEGATIVE
LEUKOCYTES UA: NEGATIVE
NITRITE UA: POSITIVE
Spec Grav, UA: 1.025
UROBILINOGEN UA: 0.2
pH, UA: 6.5

## 2014-10-07 MED ORDER — SULFAMETHOXAZOLE-TRIMETHOPRIM 800-160 MG PO TABS: 1.0000 | ORAL_TABLET | Freq: Two times a day (BID) | ORAL | Status: DC

## 2014-10-07 MED ORDER — FLUCONAZOLE 150 MG PO TABS: 150.0000 mg | ORAL_TABLET | Freq: Once | ORAL | Status: DC

## 2014-10-07 MED ORDER — FLUTICASONE PROPIONATE 50 MCG/ACT NA SUSP: 2.0000 | Freq: Every day | NASAL | Status: DC

## 2014-10-07 MED ORDER — BENZONATATE 100 MG PO CAPS: 100.0000 mg | ORAL_CAPSULE | Freq: Three times a day (TID) | ORAL | Status: DC | PRN

## 2014-10-07 NOTE — Progress Notes (Signed)
MRN: [id number] Name: Meghan Walker  Sex: female Age: 44 y.o. DOB: [birth year]  Allergies: Review of patient's allergies indicates no known allergies.  Chief Complaint  Patient presents with  . Cough    HPI: Patient is 44 y.o. female who comes today complaining of vaginal burning sensation recently the last few days, she also noticed whitish discharge ,currently patient is having menstrual periods, she also complaining of chronic cough he does complain of nasal congestion postnasal currently denies any fever chills chest pain or shortness of breath.  Past Medical History  Diagnosis Date  . UTI (lower urinary tract infection)   . Depression   . Irregular menstruation   . Hemorrhoids   . Endometriosis   . Anxiety     Past Surgical History  Procedure Laterality Date  . Cesarean section      X 3  . Appendectomy        Medication List       This list is accurate as of: 10/07/14  9:48 AM.  Always use your most recent med list.               azithromycin 250 MG tablet  Commonly known as:  ZITHROMAX Z-PAK  Take as directed     benzonatate 100 MG capsule  Commonly known as:  TESSALON  Take 1 capsule (100 mg total) by mouth 3 (three) times daily as needed for cough.     benzonatate 100 MG capsule  Commonly known as:  TESSALON  Take 1 capsule (100 mg total) by mouth 3 (three) times daily as needed for cough.     cephALEXin 500 MG capsule  Commonly known as:  KEFLEX  Take 1 capsule (500 mg total) by mouth 3 (three) times daily.     dicyclomine 10 MG capsule  Commonly known as:  BENTYL  Take 1 capsule (10 mg total) by mouth 2 (two) times daily.     fluconazole 150 MG tablet  Commonly known as:  DIFLUCAN  Take 1 tablet (150 mg total) by mouth once.     fluconazole 150 MG tablet  Commonly known as:  DIFLUCAN  Take 1 tablet (150 mg total) by mouth once.     fluticasone 50 MCG/ACT nasal spray  Commonly known as:  FLONASE  Place 2 sprays into both  nostrils daily.     hydrocortisone 25 MG suppository  Commonly known as:  ANUSOL-HC  Place 1 suppository (25 mg total) rectally 2 (two) times daily.     hydrocortisone-pramoxine 2.5-1 % rectal cream  Commonly known as:  ANALPRAM-HC     omeprazole 20 MG capsule  Commonly known as:  PRILOSEC  Take 1 capsule (20 mg total) by mouth 2 (two) times daily before a meal.     oxyCODONE-acetaminophen 5-325 MG per tablet  Commonly known as:  PERCOCET/ROXICET  Take 1 tablet by mouth every 6 (six) hours as needed for moderate pain or severe pain.     sulfamethoxazole-trimethoprim 800-160 MG per tablet  Commonly known as:  BACTRIM DS,SEPTRA DS  Take 1 tablet by mouth 2 (two) times daily.        Meds ordered this encounter  Medications  . sulfamethoxazole-trimethoprim (BACTRIM DS,SEPTRA DS) 800-160 MG per tablet    Sig: Take 1 tablet by mouth 2 (two) times daily.    Dispense:  14 tablet    Refill:  0  . fluconazole (DIFLUCAN) 150 MG tablet    Sig: Take 1 tablet (150 mg total)  by mouth once.    Dispense:  1 tablet    Refill:  0  . fluticasone (FLONASE) 50 MCG/ACT nasal spray    Sig: Place 2 sprays into both nostrils daily.    Dispense:  16 g    Refill:  6  . benzonatate (TESSALON) 100 MG capsule    Sig: Take 1 capsule (100 mg total) by mouth 3 (three) times daily as needed for cough.    Dispense:  30 capsule    Refill:  1     There is no immunization history on file for this patient.  Family History  Problem Relation Age of Onset  . Heart murmur Mother   . Ulcers Mother   . Hypertension Father   . Kidney disease Father   . Ulcers Father   . Stroke Maternal Grandmother     History  Substance Use Topics  . Smoking status: Never Smoker   . Smokeless tobacco: Never Used  . Alcohol Use: No    Review of Systems   As noted in HPI  Filed Vitals:   10/07/14 0920  BP: 124/79  Pulse: 77  Temp: 98 F (36.7 C)  Resp: 16    Physical Exam  Physical Exam  HENT:  Nasal  congestion no sinus tenderness   Eyes: EOM are normal. Pupils are equal, round, and reactive to light.  Cardiovascular: Normal rate and regular rhythm.   Pulmonary/Chest: Breath sounds normal. No respiratory distress. She has no wheezes. She has no rales.  Abdominal:  Suprapubic tenderness , no CVA tenderness   Musculoskeletal: She exhibits no edema.    CBC    Component Value Date/Time   WBC 10.9* 07/01/2014 2126   RBC 4.40 07/01/2014 2126   HGB 13.3 07/01/2014 2126   HCT 39.5 07/01/2014 2126   PLT 277 07/01/2014 2126   MCV 89.8 07/01/2014 2126   LYMPHSABS 4.1* 07/01/2014 2126   MONOABS 0.5 07/01/2014 2126   EOSABS 0.1 07/01/2014 2126   BASOSABS 0.0 07/01/2014 2126    CMP     Component Value Date/Time   NA 141 07/01/2014 2126   K 3.8 07/01/2014 2126   CL 103 07/01/2014 2126   CO2 24 07/01/2014 2126   GLUCOSE 104* 07/01/2014 2126   BUN 14 07/01/2014 2126   CREATININE 0.51 07/01/2014 2126   CREATININE 0.57 05/12/2014 1538   CALCIUM 10.2 07/01/2014 2126   PROT 7.8 07/01/2014 2126   ALBUMIN 4.1 07/01/2014 2126   AST 21 07/01/2014 2126   ALT 24 07/01/2014 2126   ALKPHOS 83 07/01/2014 2126   BILITOT <0.2* 07/01/2014 2126   GFRNONAA >90 07/01/2014 2126   GFRNONAA >89 05/12/2014 1538   GFRAA >90 07/01/2014 2126   GFRAA >89 05/12/2014 1538    Lab Results  Component Value Date/Time   CHOL 156 05/12/2014 03:38 PM    No components found for: HGA1C  Lab Results  Component Value Date/Time   AST 21 07/01/2014 09:26 PM    Assessment and Plan  UTI (lower urinary tract infection) - Plan:  Results for orders placed or performed in visit on 10/07/14  Urinalysis Dipstick  Result Value Ref Range   Color, UA yellow    Clarity, UA clear    Glucose, UA neg    Bilirubin, UA neg    Ketones, UA neg    Spec Grav, UA 1.025    Blood, UA large    pH, UA 6.5    Protein, UA trace    Urobilinogen, UA  0.2    Nitrite, UA positive    Leukocytes, UA Negative     UA shows  positive nitrites, prescribed sulfamethoxazole-trimethoprim (BACTRIM DS,SEPTRA DS) 800-160 MG per tablet, will send Urine culture, patient has blood in UA, she reported having menstrual periods currently.  Vaginal burning - Plan: Urinalysis Dipstick, fluconazole (DIFLUCAN) 150 MG tablet  Cough - Plan: sulfamethoxazole-trimethoprim (BACTRIM DS,SEPTRA DS) 800-160 MG per tablet, DG Chest 2 View, benzonatate (TESSALON) 100 MG capsule  Nasal congestion - Plan: fluticasone (FLONASE) 50 MCG/ACT nasal spray   Return in about 3 months (around 01/05/2015), or if symptoms worsen or fail to improve.  Doris Cheadle, MD  he

## 2014-10-07 NOTE — Progress Notes (Signed)
Patient here with in house interpreter Complains of cough Also complains of having some vaginal burning with a white discharge

## 2014-10-09 LAB — URINE CULTURE

## 2014-10-15 ENCOUNTER — Telehealth: Payer: Self-pay | Admitting: Internal Medicine

## 2014-10-15 NOTE — Telephone Encounter (Signed)
Patient called stating that her cough has not gotten better nor the burning sensation when urinating. Patient would like to speak to nurse. Please f/u with pt.

## 2014-10-15 NOTE — Telephone Encounter (Signed)
Pt stated finish antibiotic and not feeling Better Per Dr Orpah CobbAdvani Pt need F/U visit

## 2014-10-16 ENCOUNTER — Telehealth: Payer: Self-pay

## 2014-10-16 NOTE — Telephone Encounter (Signed)
-----   Message from Doris Cheadleeepak Advani, MD sent at 10/07/2014 11:21 AM EST ----- Call and let the patient know that her chest x-rays normal.

## 2014-10-16 NOTE — Telephone Encounter (Signed)
In house interpreter Jomarie LongsBelen used for call Patient is aware of her chest x ray results

## 2015-03-19 ENCOUNTER — Ambulatory Visit: Payer: Self-pay | Attending: Internal Medicine

## 2015-06-23 HISTORY — PX: LAPAROSCOPIC SALPINGOOPHERECTOMY: SUR795

## 2015-06-26 DIAGNOSIS — N83201 Unspecified ovarian cyst, right side: Secondary | ICD-10-CM | POA: Insufficient documentation

## 2015-06-26 DIAGNOSIS — N809 Endometriosis, unspecified: Secondary | ICD-10-CM | POA: Insufficient documentation

## 2015-09-30 IMAGING — DX DG CHEST 2V
2 series · 2 of 2 positions shown · non-contrast
Comparison: None.

CLINICAL DATA: Cough, congestion for 1 month

EXAM:
CHEST  2 VIEW

[chest pa]
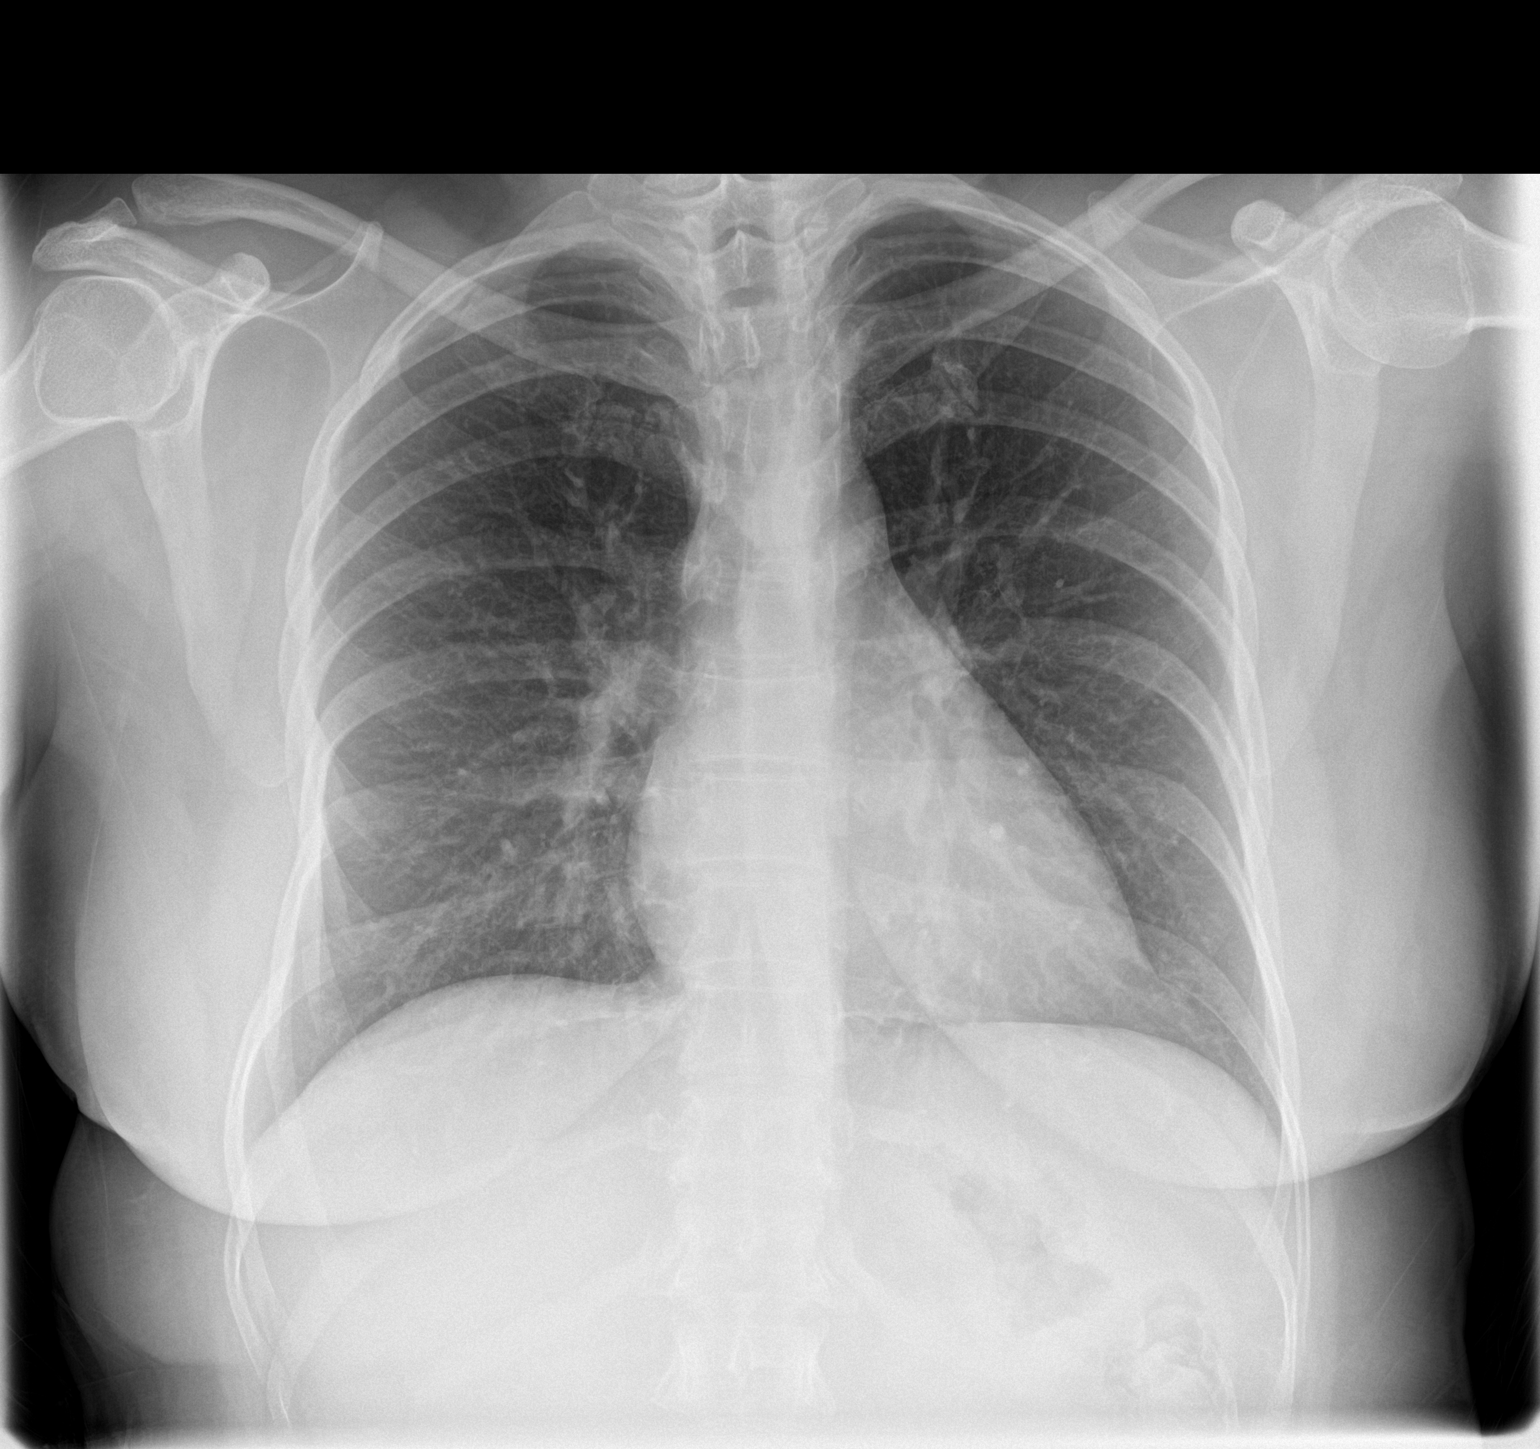

[chest lat]
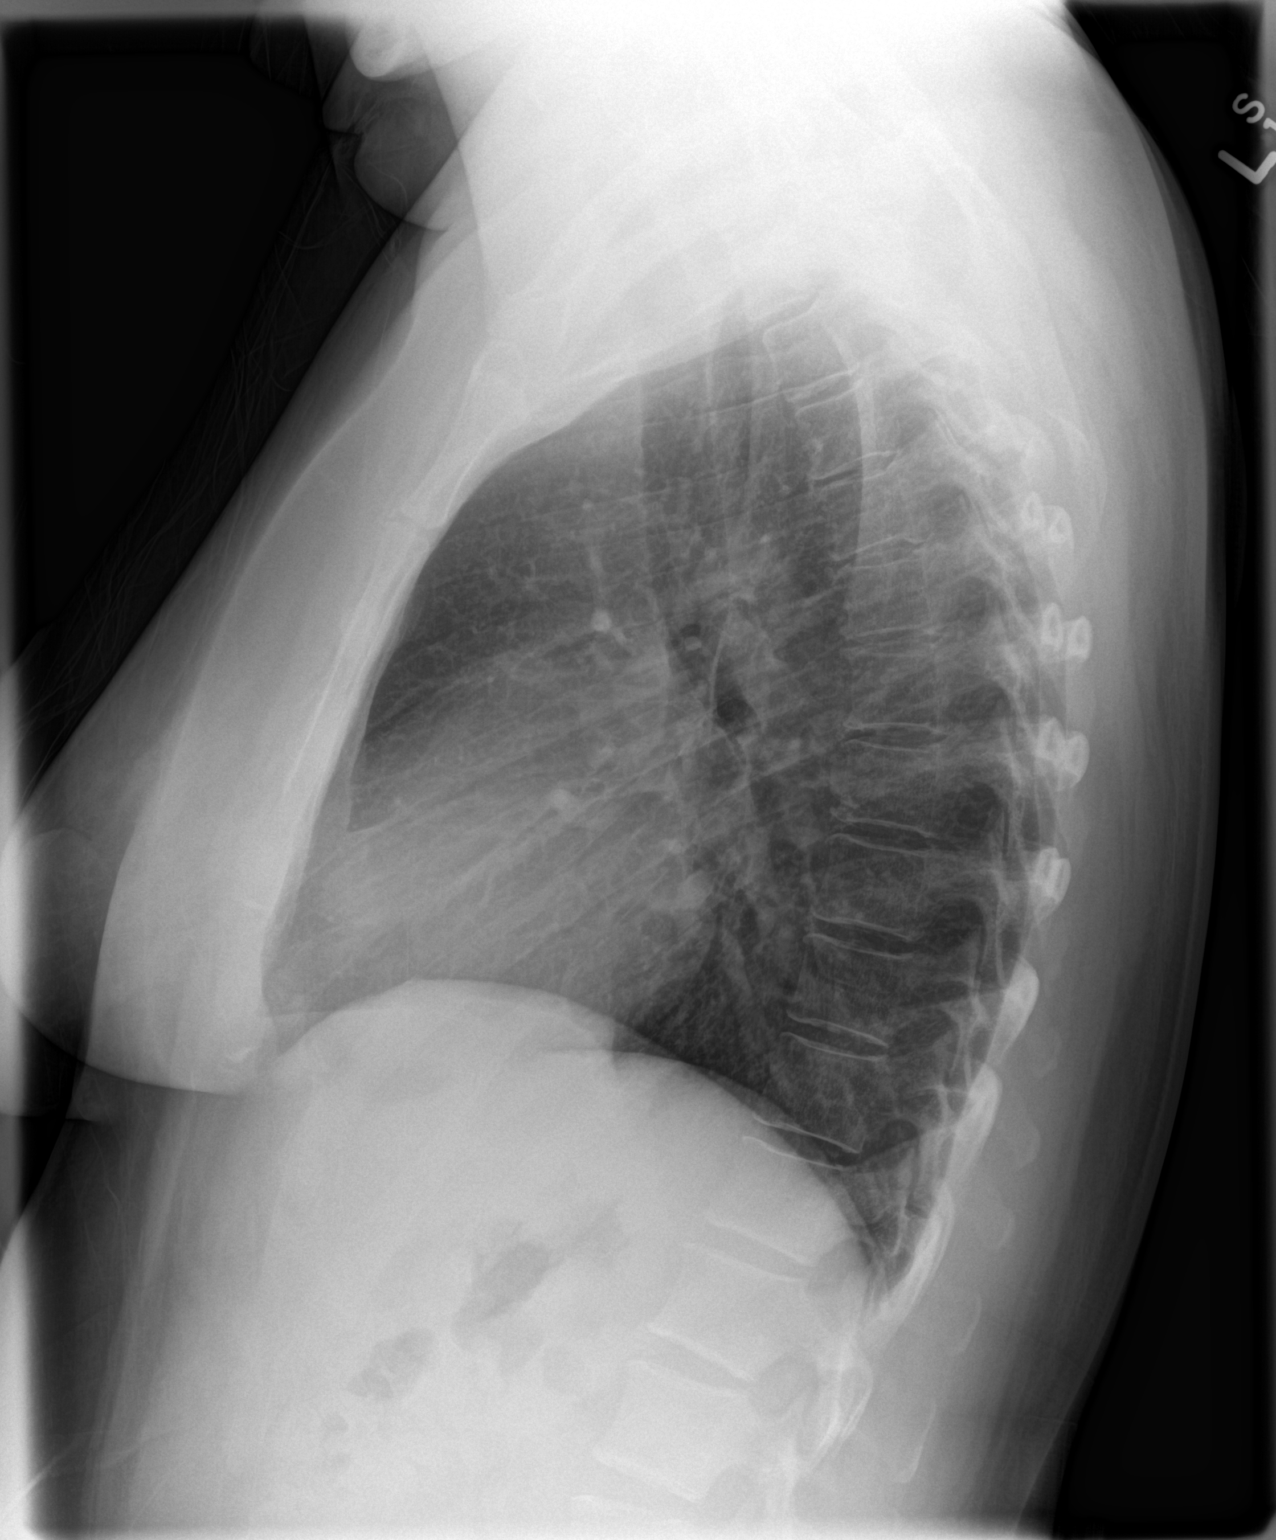

[2 of 2 positions shown; findings below may reference images not displayed]

FINDINGS: No focal infiltrate or effusion is seen. Mediastinal and hilar
contours are unremarkable. The heart is within normal limits in
size. No bony abnormality is seen.
IMPRESSION: No active cardiopulmonary disease.

## 2016-10-20 ENCOUNTER — Ambulatory Visit (HOSPITAL_COMMUNITY)
Admission: RE | Admit: 2016-10-20 | Discharge: 2016-10-20 | Disposition: A | Discharge status: Home / Self Care | Payer: Self-pay | Source: Ambulatory Visit | Attending: Obstetrics and Gynecology | Admitting: Obstetrics and Gynecology

## 2016-10-20 ENCOUNTER — Encounter (HOSPITAL_COMMUNITY): Payer: Self-pay

## 2016-10-20 VITALS — BP 102/64 | Ht <= 58 in | Wt 140.6 lb

## 2016-10-20 DIAGNOSIS — Z01419 Encounter for gynecological examination (general) (routine) without abnormal findings: Secondary | ICD-10-CM

## 2016-10-20 NOTE — Patient Instructions (Signed)
Explained breast self awareness with Meghan Walker. Let patient  know [facility] will cover Pap smears and HPV typing every 5 years unless has a history of abnormal Pap smears. Referred patient to [facility] for a screening mammogram. Appointment scheduled for Thursday, October 20, 2016 at 1300. Let patient know will follow up with her within the next couple weeks with results with results of Pap smear by phone. Informed patient that [facility] will follow up with her mammogram results. Meghan Walker verbalized understanding.  Caylon Saine, Kathaleen Maserhristine Poll, RN 12:46 PM

## 2016-10-20 NOTE — Progress Notes (Signed)
No complaints today.   Pap Smear: Pap smear completed today. Last Pap smear was in 2015 at [facility] and normal. Per patient has no history of an abnormal Pap smear. No Pap smear results are in EPIC.  Physical exam: Breasts Breasts symmetrical. No skin abnormalities bilateral breasts. No nipple retraction bilateral breasts. No nipple discharge bilateral breasts. No lymphadenopathy. No lumps palpated bilateral breasts. No complaints of pain or tenderness on exam. Referred patient to [facility] for a screening mammogram. Appointment scheduled for Thursday, October 20, 2016 at 1300.  Pelvic/Bimanual   Ext Genitalia No lesions, no swelling and no discharge observed on external genitalia.         Vagina Vagina pink and normal texture. No lesions or discharge observed in vagina.          Cervix Cervix is present. Cervix pink and of normal texture. No discharge observed.     Uterus Uterus is present and palpable. Uterus in normal position and normal size.        Adnexae Bilateral ovaries present and palpable. No tenderness on palpation.          Rectovaginal No rectal exam completed today since patient had no rectal complaints. No skin abnormalities observed on exam.    Smoking History: Patient has never smoked.  Patient Navigation: Patient education provided. Access to services provided [facility]. Spanish interpreter provided.   Used Spanish interpreter [employer]anca Lindner from [employer].

## 2016-10-21 ENCOUNTER — Encounter (HOSPITAL_COMMUNITY): Payer: Self-pay | Admitting: *Deleted

## 2016-10-21 LAB — CYTOLOGY - PAP
Diagnosis: NEGATIVE
HPV (WINDOPATH): NOT DETECTED

## 2016-10-26 ENCOUNTER — Telehealth (HOSPITAL_COMMUNITY): Payer: Self-pay | Admitting: *Deleted

## 2016-10-26 NOTE — Telephone Encounter (Signed)
Telephoned patient at home number and advised of negative pap smear results. HPV was negative. Next pap smear due in five years. Patient voiced understanding. Used interpreter Julie Sowell. 

## 2016-11-24 ENCOUNTER — Encounter (HOSPITAL_COMMUNITY): Payer: Self-pay | Admitting: *Deleted

## 2016-11-25 ENCOUNTER — Encounter (HOSPITAL_COMMUNITY): Payer: Self-pay | Admitting: *Deleted

## 2018-08-22 HISTORY — PX: HAND SURGERY: SHX662

## 2019-04-16 ENCOUNTER — Emergency Department (HOSPITAL_COMMUNITY): Payer: Self-pay | Admitting: Anesthesiology

## 2019-04-16 ENCOUNTER — Emergency Department (HOSPITAL_COMMUNITY)
Admission: EM | Admit: 2019-04-16 | Discharge: 2019-04-17 | Disposition: A | Discharge status: Home / Self Care | Payer: Self-pay | Attending: Emergency Medicine | Admitting: Emergency Medicine

## 2019-04-16 ENCOUNTER — Other Ambulatory Visit: Payer: Self-pay

## 2019-04-16 ENCOUNTER — Emergency Department (HOSPITAL_COMMUNITY): Payer: Self-pay

## 2019-04-16 ENCOUNTER — Encounter (HOSPITAL_COMMUNITY)
Admission: EM | Disposition: A | Discharge status: Home / Self Care | Payer: Self-pay | Source: Home / Self Care | Attending: Emergency Medicine

## 2019-04-16 DIAGNOSIS — Y93G3 Activity, cooking and baking: Secondary | ICD-10-CM | POA: Insufficient documentation

## 2019-04-16 DIAGNOSIS — S61412A Laceration without foreign body of left hand, initial encounter: Secondary | ICD-10-CM | POA: Diagnosis not present

## 2019-04-16 DIAGNOSIS — W260XXA Contact with knife, initial encounter: Secondary | ICD-10-CM | POA: Diagnosis not present

## 2019-04-16 DIAGNOSIS — S64491A Injury of digital nerve of left index finger, initial encounter: Secondary | ICD-10-CM | POA: Diagnosis not present

## 2019-04-16 DIAGNOSIS — S6432XA Injury of digital nerve of left thumb, initial encounter: Secondary | ICD-10-CM | POA: Diagnosis present

## 2019-04-16 DIAGNOSIS — Z23 Encounter for immunization: Secondary | ICD-10-CM | POA: Insufficient documentation

## 2019-04-16 DIAGNOSIS — Z20828 Contact with and (suspected) exposure to other viral communicable diseases: Secondary | ICD-10-CM | POA: Insufficient documentation

## 2019-04-16 HISTORY — PX: I & D EXTREMITY: SHX5045

## 2019-04-16 LAB — CBC WITH DIFFERENTIAL/PLATELET
Abs Immature Granulocytes: 0.03 10*3/uL (ref 0.00–0.07)
Basophils Absolute: 0.1 10*3/uL (ref 0.0–0.1)
Basophils Relative: 1 %
Eosinophils Absolute: 0 10*3/uL (ref 0.0–0.5)
Eosinophils Relative: 0 %
HCT: 38.8 % (ref 36.0–46.0)
Hemoglobin: 13.1 g/dL (ref 12.0–15.0)
Immature Granulocytes: 0 %
Lymphocytes Relative: 28 %
Lymphs Abs: 3 10*3/uL (ref 0.7–4.0)
MCH: 30.1 pg (ref 26.0–34.0)
MCHC: 33.8 g/dL (ref 30.0–36.0)
MCV: 89.2 fL (ref 80.0–100.0)
Monocytes Absolute: 0.5 10*3/uL (ref 0.1–1.0)
Monocytes Relative: 5 %
Neutro Abs: 7 10*3/uL (ref 1.7–7.7)
Neutrophils Relative %: 66 %
Platelets: 250 10*3/uL (ref 150–400)
RBC: 4.35 MIL/uL (ref 3.87–5.11)
RDW: 12.4 % (ref 11.5–15.5)
WBC: 10.6 10*3/uL — ABNORMAL HIGH (ref 4.0–10.5)
nRBC: 0 % (ref 0.0–0.2)

## 2019-04-16 LAB — BASIC METABOLIC PANEL
Anion gap: 9 (ref 5–15)
BUN: 11 mg/dL (ref 6–20)
CO2: 22 mmol/L (ref 22–32)
Calcium: 8.9 mg/dL (ref 8.9–10.3)
Chloride: 105 mmol/L (ref 98–111)
Creatinine, Ser: 0.56 mg/dL (ref 0.44–1.00)
GFR calc Af Amer: >60 mL/min (ref >60)
GFR calc non Af Amer: >60 mL/min (ref >60)
Glucose, Bld: 96 mg/dL (ref 70–99)
Potassium: 3.3 mmol/L — ABNORMAL LOW (ref 3.5–5.1)
Sodium: 136 mmol/L (ref 135–145)

## 2019-04-16 LAB — SARS CORONAVIRUS 2 (TAT 6-24 HRS): SARS Coronavirus 2: NEGATIVE

## 2019-04-16 LAB — I-STAT BETA HCG BLOOD, ED (MC, WL, AP ONLY): I-stat hCG, quantitative: <5 m[IU]/mL (ref <5)

## 2019-04-16 SURGERY — IRRIGATION AND DEBRIDEMENT EXTREMITY
Anesthesia: General | Laterality: Left

## 2019-04-16 MED ORDER — TETANUS-DIPHTH-ACELL PERTUSSIS 5-2.5-18.5 LF-MCG/0.5 IM SUSP
0.5000 mL | Freq: Once | INTRAMUSCULAR | Status: AC
  Administered 2019-04-16: 15:00:00 0.5 mL via INTRAMUSCULAR
  Filled 2019-04-16: qty 0.5

## 2019-04-16 MED ORDER — ACETAMINOPHEN 325 MG PO TABS
650.0000 mg | ORAL_TABLET | Freq: Once | ORAL | Status: DC
  Filled 2019-04-16: qty 2

## 2019-04-16 MED ORDER — PROPOFOL 10 MG/ML IV BOLUS
INTRAVENOUS | Status: DC | PRN
  Administered 2019-04-16: 120 mg via INTRAVENOUS

## 2019-04-16 MED ORDER — CEFAZOLIN SODIUM 1 G IJ SOLR
INTRAMUSCULAR | Status: AC
  Filled 2019-04-16: qty 20

## 2019-04-16 MED ORDER — BUPIVACAINE HCL (PF) 0.5 % IJ SOLN
10.0000 mL | Freq: Once | INTRAMUSCULAR | Status: AC
Start: 2019-04-16 — End: 2019-04-16
  Administered 2019-04-16: 15:00:00 10 mL
  Filled 2019-04-16: qty 10

## 2019-04-16 MED ORDER — CEFAZOLIN SODIUM-DEXTROSE 2-3 GM-%(50ML) IV SOLR
INTRAVENOUS | Status: DC | PRN
  Administered 2019-04-16: 2 g via INTRAVENOUS

## 2019-04-16 MED ORDER — SUCCINYLCHOLINE 20MG/ML (10ML) SYRINGE FOR MEDFUSION PUMP - OPTIME
INTRAMUSCULAR | Status: DC | PRN
  Administered 2019-04-16: 110 mg via INTRAVENOUS

## 2019-04-16 MED ORDER — ONDANSETRON HCL 4 MG/2ML IJ SOLN
4.0000 mg | Freq: Once | INTRAMUSCULAR | Status: AC
  Administered 2019-04-16: 4 mg via INTRAVENOUS
  Filled 2019-04-16: qty 2

## 2019-04-16 MED ORDER — SUCCINYLCHOLINE CHLORIDE 200 MG/10ML IV SOSY
PREFILLED_SYRINGE | INTRAVENOUS | Status: AC
  Filled 2019-04-16: qty 10

## 2019-04-16 MED ORDER — MORPHINE SULFATE (PF) 4 MG/ML IV SOLN: 4.0000 mg | Freq: Once | INTRAVENOUS | Status: DC | PRN

## 2019-04-16 MED ORDER — MIDAZOLAM HCL 2 MG/2ML IJ SOLN
INTRAMUSCULAR | Status: DC | PRN
  Administered 2019-04-16: 2 mg via INTRAVENOUS

## 2019-04-16 MED ORDER — MORPHINE SULFATE (PF) 4 MG/ML IV SOLN: 4.0000 mg | Freq: Once | INTRAVENOUS | Status: DC

## 2019-04-16 MED ORDER — MORPHINE SULFATE (PF) 4 MG/ML IV SOLN
4.0000 mg | Freq: Once | INTRAVENOUS | Status: AC
  Administered 2019-04-16: 19:00:00 4 mg via INTRAVENOUS
  Filled 2019-04-16: qty 1

## 2019-04-16 MED ORDER — MORPHINE SULFATE (PF) 4 MG/ML IV SOLN
4.0000 mg | Freq: Once | INTRAVENOUS | Status: AC
  Administered 2019-04-16: 17:00:00 4 mg via INTRAVENOUS
  Filled 2019-04-16: qty 1

## 2019-04-16 MED ORDER — LACTATED RINGERS IV SOLN
INTRAVENOUS | Status: DC | PRN
  Administered 2019-04-16: 23:00:00 via INTRAVENOUS

## 2019-04-16 MED ORDER — DEXAMETHASONE SODIUM PHOSPHATE 10 MG/ML IJ SOLN
INTRAMUSCULAR | Status: DC | PRN
  Administered 2019-04-16: 10 mg via INTRAVENOUS

## 2019-04-16 MED ORDER — FENTANYL CITRATE (PF) 250 MCG/5ML IJ SOLN
INTRAMUSCULAR | Status: AC
  Filled 2019-04-16: qty 5

## 2019-04-16 MED ORDER — LIDOCAINE 2% (20 MG/ML) 5 ML SYRINGE
INTRAMUSCULAR | Status: AC
  Filled 2019-04-16: qty 5

## 2019-04-16 MED ORDER — MIDAZOLAM HCL 2 MG/2ML IJ SOLN
INTRAMUSCULAR | Status: AC
  Filled 2019-04-16: qty 2

## 2019-04-16 MED ORDER — LIDOCAINE-EPINEPHRINE 2 %-1:200000 IJ SOLN
10.0000 mL | Freq: Once | INTRAMUSCULAR | Status: AC
Start: 2019-04-16 — End: 2019-04-16
  Administered 2019-04-16: 10 mL
  Filled 2019-04-16: qty 20

## 2019-04-16 MED ORDER — LIDOCAINE HCL (CARDIAC) PF 100 MG/5ML IV SOSY
PREFILLED_SYRINGE | INTRAVENOUS | Status: DC | PRN
  Administered 2019-04-16: 100 mg via INTRATRACHEAL

## 2019-04-16 MED ORDER — PROPOFOL 10 MG/ML IV BOLUS
INTRAVENOUS | Status: AC
  Filled 2019-04-16: qty 20

## 2019-04-16 SURGICAL SUPPLY — 51 items
BNDG CMPR 9X4 STRL LF SNTH (GAUZE/BANDAGES/DRESSINGS) ×1
BNDG CONFORM 2 STRL LF (GAUZE/BANDAGES/DRESSINGS) IMPLANT
BNDG ELASTIC 3X5.8 VLCR STR LF (GAUZE/BANDAGES/DRESSINGS) ×4 IMPLANT
BNDG ELASTIC 4X5.8 VLCR STR LF (GAUZE/BANDAGES/DRESSINGS) ×3 IMPLANT
BNDG ESMARK 4X9 LF (GAUZE/BANDAGES/DRESSINGS) ×2 IMPLANT
BNDG GAUZE ELAST 4 BULKY (GAUZE/BANDAGES/DRESSINGS) ×7 IMPLANT
CORD BIPOLAR FORCEPS 12FT (ELECTRODE) ×3 IMPLANT
COVER SURGICAL LIGHT HANDLE (MISCELLANEOUS) ×3 IMPLANT
COVER WAND RF STERILE (DRAPES) ×1 IMPLANT
CUFF TOURN SGL QUICK 18X4 (TOURNIQUET CUFF) ×3 IMPLANT
CUFF TOURN SGL QUICK 24 (TOURNIQUET CUFF)
CUFF TRNQT CYL 24X4X16.5-23 (TOURNIQUET CUFF) IMPLANT
DRAIN TLS ROUND 10FR (DRAIN) ×2 IMPLANT
DRSG ADAPTIC 3X8 NADH LF (GAUZE/BANDAGES/DRESSINGS) ×1 IMPLANT
DRSG EMULSION OIL 3X3 NADH (GAUZE/BANDAGES/DRESSINGS) ×2 IMPLANT
GAUZE SPONGE 4X4 12PLY STRL (GAUZE/BANDAGES/DRESSINGS) ×3 IMPLANT
GAUZE XEROFORM 1X8 LF (GAUZE/BANDAGES/DRESSINGS) ×3 IMPLANT
GAUZE XEROFORM 5X9 LF (GAUZE/BANDAGES/DRESSINGS) ×2 IMPLANT
GLOVE BIO SURGEON STRL SZ 6.5 (GLOVE) ×1 IMPLANT
GLOVE BIO SURGEONS STRL SZ 6.5 (GLOVE) ×1
GLOVE BIOGEL M 8.0 STRL (GLOVE) ×3 IMPLANT
GLOVE INDICATOR 7.5 STRL GRN (GLOVE) ×2 IMPLANT
GLOVE SS BIOGEL STRL SZ 8 (GLOVE) ×1 IMPLANT
GLOVE SUPERSENSE BIOGEL SZ 8 (GLOVE) ×2
GLOVE SURG SS PI 7.5 STRL IVOR (GLOVE) ×2 IMPLANT
GOWN STRL REUS W/ TWL LRG LVL3 (GOWN DISPOSABLE) ×1 IMPLANT
GOWN STRL REUS W/ TWL XL LVL3 (GOWN DISPOSABLE) ×2 IMPLANT
GOWN STRL REUS W/TWL LRG LVL3 (GOWN DISPOSABLE) ×3
GOWN STRL REUS W/TWL XL LVL3 (GOWN DISPOSABLE) ×3
KIT BASIN OR (CUSTOM PROCEDURE TRAY) ×3 IMPLANT
KIT TURNOVER KIT B (KITS) ×3 IMPLANT
MANIFOLD NEPTUNE II (INSTRUMENTS) ×1 IMPLANT
NDL HYPO 25GX1X1/2 BEV (NEEDLE) IMPLANT
NEEDLE HYPO 25GX1X1/2 BEV (NEEDLE) IMPLANT
NS IRRIG 1000ML POUR BTL (IV SOLUTION) ×3 IMPLANT
PACK ORTHO EXTREMITY (CUSTOM PROCEDURE TRAY) ×3 IMPLANT
PAD ARMBOARD 7.5X6 YLW CONV (MISCELLANEOUS) ×3 IMPLANT
PAD CAST 4YDX4 CTTN HI CHSV (CAST SUPPLIES) ×1 IMPLANT
PADDING CAST COTTON 4X4 STRL (CAST SUPPLIES) ×3
SET CYSTO W/LG BORE CLAMP LF (SET/KITS/TRAYS/PACK) ×3 IMPLANT
SOL PREP POV-IOD 4OZ 10% (MISCELLANEOUS) ×6 IMPLANT
SPONGE LAP 4X18 RFD (DISPOSABLE) ×3 IMPLANT
SUT PROLENE 4 0 P 3 18 (SUTURE) ×4 IMPLANT
SWAB CULTURE ESWAB REG 1ML (MISCELLANEOUS) IMPLANT
SYR CONTROL 10ML LL (SYRINGE) IMPLANT
TOWEL GREEN STERILE (TOWEL DISPOSABLE) ×3 IMPLANT
TOWEL GREEN STERILE FF (TOWEL DISPOSABLE) ×3 IMPLANT
TUBE CONNECTING 12'X1/4 (SUCTIONS) ×1
TUBE CONNECTING 12X1/4 (SUCTIONS) ×2 IMPLANT
WATER STERILE IRR 1000ML POUR (IV SOLUTION) ×3 IMPLANT
YANKAUER SUCT BULB TIP NO VENT (SUCTIONS) ×3 IMPLANT

## 2019-04-16 NOTE — ED Notes (Signed)
ED TO INPATIENT HANDOFF REPORT  ED Nurse Name and Phone #:  Patty [phone]  S Name/Age/Gender Camillo Flaming Sanchez-Gonzalez 48 y.o. female Room/Bed: 007C/007C  Code Status   Code Status: Not on file  Home/SNF/Other Home Patient oriented to: self, place, time and situation Is this baseline? Yes   Triage Complete: Triage complete  Chief Complaint HAND INJURY  Triage Note Pt here from work, where she cut her hand with a knife. Puncture wound in between thumb and index finger. Bleeding controlled, PMS intact.    Allergies No Known Allergies  Level of Care/Admitting Diagnosis ED Disposition    ED Disposition Condition Comment   Admit  The patient appears reasonably stabilized for admission considering the current resources, flow, and capabilities available in the ED at this time, and I doubt any other [facility] requiring further screening and/or treatment in the ED prior to admission is  present.       B Medical/Surgery History Past Medical History:  Diagnosis Date  . Anxiety   . Depression   . Endometriosis   . Hemorrhoids   . Irregular menstruation   . UTI (lower urinary tract infection)    Past Surgical History:  Procedure Laterality Date  . APPENDECTOMY    . CESAREAN SECTION     X 3     A IV Location/Drains/Wounds Patient Lines/Drains/Airways Status   Active Line/Drains/Airways    None          Intake/Output Last 24 hours No intake or output data in the 24 hours ending 04/16/19 1628  Labs/Imaging No results found for this or any previous visit (from the past 73 hour(s)). Dg Hand Complete Left  Result Date: 04/16/2019 CLINICAL DATA:  Left hand laceration EXAM: LEFT HAND - COMPLETE 3+ VIEW COMPARISON:  None. FINDINGS: There is no evidence of fracture or dislocation. There is no evidence of arthropathy or other focal bone abnormality. Prominent soft tissue swelling within the interspace between the first and second metacarpals. No radiopaque foreign body.  IMPRESSION: No acute fracture or radiopaque foreign body. Electronically Signed   By: Davina Poke M.D.   On: 04/16/2019 15:51    Pending Labs Unresulted Labs (From admission, onward)    Start     Ordered   04/16/19 1531  SARS CORONAVIRUS 2 (TAT 6-12 HRS) Nasal Swab Aptima Multi Swab  (Asymptomatic/Tier 2 Patients Labs)  Once,   STAT    Question Answer Comment  Is this test for diagnosis or screening Screening   Symptomatic for COVID-19 as defined by CDC No   Hospitalized for COVID-19 No   Admitted to ICU for COVID-19 No   Previously tested for COVID-19 No   Resident in a congregate (group) care setting No   Employed in healthcare setting No   Pregnant No      04/16/19 1530          Vitals/Pain Today's Vitals   04/16/19 1348 04/16/19 1515  BP: (!) 152/106 (!) 142/92  Pulse: 75 83  Resp: 18   Temp: 98 F (36.7 C)   TempSrc: Oral   SpO2: 99% 97%    Isolation Precautions No active isolations  Medications Medications  bupivacaine (MARCAINE) 0.5 % injection 10 mL (10 mLs Infiltration Given 04/16/19 1527)  lidocaine-EPINEPHrine (XYLOCAINE W/EPI) 2 %-1:200000 (PF) injection 10 mL (10 mLs Infiltration Given 04/16/19 1527)  Tdap (BOOSTRIX) injection 0.5 mL (0.5 mLs Intramuscular Given 04/16/19 1521)    Mobility walks Low fall risk   Focused Assessments  R Recommendations: See Admitting Provider Note  Report given to:   Additional Notes:

## 2019-04-16 NOTE — ED Notes (Signed)
All info thu interpreter [employer]

## 2019-04-16 NOTE — ED Notes (Signed)
Pt called out and her hand was bleeding , handbandage unwrapped and pt had small arterial spurt, PA S Joy called and   Marcaine and epi obtained for temp sutures, Silvestre Gunner ortho called and he came to see

## 2019-04-16 NOTE — Consult Note (Signed)
Reason for Consult:Meghan Walker Referring Physician: E Jezabella Schriever is an 48 y.o. female.  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 She came to the ED for evaluation. Pulsatile bleeding was noted at one point and Walker surgery was consulted. She is RHD.  Past Medical History:  Diagnosis Date  . Anxiety   . Depression   . Endometriosis   . Hemorrhoids   . Irregular menstruation   . UTI (lower urinary tract infection)     Past Surgical History:  Procedure Laterality Date  . APPENDECTOMY    . CESAREAN SECTION     X 3    Family History  Problem Relation Age of Onset  . Heart murmur Mother   . Ulcers Mother   . Hypertension Father   . Kidney disease Father   . Gout Father   . Stroke Maternal Grandmother     Social History:  reports that she has never smoked. She has never used smokeless tobacco. She reports that she does not drink alcohol or use drugs.  Allergies: No Known Allergies  Medications: I have reviewed the patient's current medications.  No results found for this or any previous visit (from the past 48 hour(s)).  No results found.  Review of Systems  Constitutional: Negative for weight loss.  HENT: Negative for ear discharge, ear pain, hearing loss and tinnitus.   Eyes: Negative for blurred vision, double vision, photophobia and pain.  Respiratory: Negative for cough, sputum production and shortness of breath.   Cardiovascular: Negative for chest pain.  Gastrointestinal: Negative for abdominal pain, nausea and vomiting.  Genitourinary: Negative for dysuria, flank pain, frequency and urgency.  Musculoskeletal: Positive for joint pain (Left Walker). Negative for back pain, falls, myalgias and neck pain.  Neurological: Negative for dizziness, tingling, sensory change, focal weakness, loss of consciousness and headaches.  Endo/Heme/Allergies: Does not bruise/bleed  easily.  Psychiatric/Behavioral: Negative for depression, memory loss and substance abuse. The patient is not nervous/anxious.    Blood pressure (!) 152/106, pulse 75, temperature 98 F (36.7 C), temperature source Oral, resp. rate 18, SpO2 99 %. Physical Exam  Constitutional: She appears well-developed and well-nourished. No distress.  HENT:  Head: Normocephalic and atraumatic.  Eyes: Conjunctivae are normal. Right eye exhibits no discharge. Left eye exhibits no discharge. No scleral icterus.  Neck: Normal range of motion.  Cardiovascular: Normal rate and regular rhythm.  Respiratory: Effort normal. No respiratory distress.  Musculoskeletal:     Comments: Left shoulder, elbow, wrist, digits- Meghan thenar eminence near webspace, exits dorsally, thumb abd/add/flex/ext 5/5, mild paresthesias index, brisk but no pulsatile bleeding on my exam but several sprays of blood 5-6 feet away, no instability, no blocks to motion  Sens  Ax/R/M/U intact  Mot   Ax/ R/ PIN/ M/ AIN/ U intact  Rad 2+  Neurological: She is alert.  Skin: Skin is warm and dry. She is not diaphoretic.  Psychiatric: She has a normal mood and affect. Her behavior is normal.    Assessment/Plan: Meghan Walker -- Will plan I&D, N/A/T repair by Dr. Amedeo Plenty this evening. Anticipate discharge after surgery.    Lisette Abu, PA-C Orthopedic Surgery [phone] 04/16/2019, 3:09 PM

## 2019-04-16 NOTE — ED Notes (Signed)
Dr. Amedeo Plenty paged to [place], [place] paged by Levada Dy

## 2019-04-16 NOTE — ED Triage Notes (Signed)
Pt here from work, where she cut her hand with a knife. Puncture wound in between thumb and index finger. Bleeding controlled, PMS intact.

## 2019-04-16 NOTE — ED Provider Notes (Addendum)
[facility] Provider Note   CSN: [id number] Arrival date & time: 04/16/19  1331     History   Chief Complaint Chief Complaint  Patient presents with  . Hand Injury    HPI Meghan Walker is a 48 y.o. female.     The history is provided by the patient. A language interpreter was used ([place]).     Meghan Walker is a 48 y.o. female, patient with no pertinent past medical history, presenting to the ED with injury to left hand that occurred shortly before arrival. Patient was trying to take the pit of an avocado out with a knife, slipped, and the knife cut into her left palm and out the dorsal side of the hand. Her pain is severe, sharp, localized to the region of the laceration. Unknown tetanus status.  Last food was this morning for breakfast. Denies numbness, weakness, other injuries.   Past Medical History:  Diagnosis Date  . Anxiety   . Depression   . Endometriosis   . Hemorrhoids   . Irregular menstruation   . UTI (lower urinary tract infection)     Patient Active Problem List   Diagnosis Date Noted  . Rectal bleeding 07/08/2014  . AP (abdominal pain) 07/08/2014  . Hemorrhoid 05/12/2014    Past Surgical History:  Procedure Laterality Date  . APPENDECTOMY    . CESAREAN SECTION     X 3     OB History    Gravida  3   Para  3   Term  3   Preterm  0   AB  0   Living  3     SAB  0   TAB  0   Ectopic  0   Multiple  0   Live Births               Home Medications    Prior to Admission medications   Medication Sig Start Date End Date Taking? Authorizing Provider  ibuprofen (ADVIL) 200 MG tablet Take 600 mg by mouth every 6 (six) hours as needed for moderate pain or cramping.   Yes [provider]  azithromycin (ZITHROMAX Z-PAK) 250 MG tablet Take as directed Patient not taking: Reported on 10/20/2016 09/15/14   Doris CheadleAdvani, Deepak, MD  benzonatate (TESSALON) 100 MG capsule  Take 1 capsule (100 mg total) by mouth 3 (three) times daily as needed for cough. Patient not taking: Reported on 10/20/2016 09/15/14   Doris CheadleAdvani, Deepak, MD  benzonatate (TESSALON) 100 MG capsule Take 1 capsule (100 mg total) by mouth 3 (three) times daily as needed for cough. Patient not taking: Reported on 10/20/2016 10/07/14   Doris CheadleAdvani, Deepak, MD  cephALEXin (KEFLEX) 500 MG capsule Take 1 capsule (500 mg total) by mouth 3 (three) times daily. Patient not taking: Reported on 10/20/2016 06/02/14   Reuben LikesKeller, David C, MD  dicyclomine (BENTYL) 10 MG capsule Take 1 capsule (10 mg total) by mouth 2 (two) times daily. Patient not taking: Reported on 10/20/2016 07/08/14   Zehr, Princella PellegriniJessica D, PA-C  fluconazole (DIFLUCAN) 150 MG tablet Take 1 tablet (150 mg total) by mouth once. Patient not taking: Reported on 10/20/2016 09/15/14   Doris CheadleAdvani, Deepak, MD  fluconazole (DIFLUCAN) 150 MG tablet Take 1 tablet (150 mg total) by mouth once. Patient not taking: Reported on 10/20/2016 10/07/14   Doris CheadleAdvani, Deepak, MD  fluticasone (FLONASE) 50 MCG/ACT nasal spray Place 2 sprays into both nostrils daily. Patient not taking: Reported on 10/20/2016  10/07/14   Doris CheadleAdvani, Deepak, MD  hydrocortisone (ANUSOL-HC) 25 MG suppository Place 1 suppository (25 mg total) rectally 2 (two) times daily. Patient not taking: Reported on 10/20/2016 05/12/14   Doris CheadleAdvani, Deepak, MD  omeprazole (PRILOSEC) 20 MG capsule Take 1 capsule (20 mg total) by mouth 2 (two) times daily before a meal. Patient not taking: Reported on 10/20/2016 06/02/14   Reuben LikesKeller, David C, MD  oxyCODONE-acetaminophen (PERCOCET/ROXICET) 5-325 MG per tablet Take 1 tablet by mouth every 6 (six) hours as needed for moderate pain or severe pain. Patient not taking: Reported on 08/29/2014 07/02/14   Horton, Mayer Maskerourtney F, MD  sulfamethoxazole-trimethoprim (BACTRIM DS,SEPTRA DS) 800-160 MG per tablet Take 1 tablet by mouth 2 (two) times daily. Patient not taking: Reported on 10/20/2016 10/07/14   Doris CheadleAdvani, Deepak, MD     Family History Family History  Problem Relation Age of Onset  . Heart murmur Mother   . Ulcers Mother   . Hypertension Father   . Kidney disease Father   . Gout Father   . Stroke Maternal Grandmother     Social History Social History   Tobacco Use  . Smoking status: Never Smoker  . Smokeless tobacco: Never Used  Substance Use Topics  . Alcohol use: No  . Drug use: No     Allergies   Patient has no known allergies.   Review of Systems Review of Systems  Skin: Positive for wound.  Neurological: Negative for weakness and numbness.     Physical Exam Updated Vital Signs BP (!) 142/92   Pulse 83   Temp 98 F (36.7 C) (Oral)   Resp 18   SpO2 97%   Physical Exam Vitals signs and nursing note reviewed.  Constitutional:      General: She is not in acute distress.    Appearance: She is well-developed. She is not diaphoretic.  HENT:     Head: Normocephalic and atraumatic.  Eyes:     Conjunctiva/sclera: Conjunctivae normal.  Neck:     Musculoskeletal: Neck supple.  Cardiovascular:     Rate and Rhythm: Normal rate and regular rhythm.     Pulses:          Radial pulses are 2+ on the right side and 2+ on the left side.  Pulmonary:     Effort: Pulmonary effort is normal.  Musculoskeletal:     Comments: Approximately 2 cm laceration to the left palm just distal to the thenar eminence with steady hemorrhage. With manipulation of the wound, there is noted to be pressurized hemorrhage, which was initially difficult to control.  Improvement with hemorrhage occurred with sustained direct pressure and then with suturing. There is an approximately 1 cm laceration to the dorsum of the left hand.  Hemorrhage controlled. She appears to have full range of motion in the fingers of the left hand as well as the wrist.   There is localized tenderness and swelling around the wounds, but no tenderness or swelling elsewhere in the hand.   There is no swelling or tenderness to the left  wrist.  Skin:    General: Skin is warm and dry.     Capillary Refill: Capillary refill takes less than 2 seconds.     Coloration: Skin is not pale.  Neurological:     Mental Status: She is alert.     Comments: Sensation to light touch grossly intact in the left hand and fingers. She also has good strength in each of the cardinal directions of the left  thumb as well as with flexion and extension of the left index finger.  Psychiatric:        Behavior: Behavior normal.                ED Treatments / Results  Labs (all labs ordered are listed, but only abnormal results are displayed) Labs Reviewed  BASIC METABOLIC PANEL - Abnormal; Notable for the following components:      Result Value   Potassium 3.3 (*)    All other components within normal limits  CBC WITH DIFFERENTIAL/PLATELET - Abnormal; Notable for the following components:   WBC 10.6 (*)    All other components within normal limits  SARS CORONAVIRUS 2 (TAT 6-12 HRS)  I-STAT BETA HCG BLOOD, ED ([facility], [facility], [facility] ONLY)    EKG None  Radiology Dg Hand Complete Left  Result Date: 04/16/2019 CLINICAL DATA:  Left hand laceration EXAM: LEFT HAND - COMPLETE 3+ VIEW COMPARISON:  None. FINDINGS: There is no evidence of fracture or dislocation. There is no evidence of arthropathy or other focal bone abnormality. Prominent soft tissue swelling within the interspace between the first and second metacarpals. No radiopaque foreign body. IMPRESSION: No acute fracture or radiopaque foreign body. Electronically Signed   By: Duanne Guess M.D.   On: 04/16/2019 15:51    Procedures .Marland KitchenLaceration Repair  Date/Time: 04/16/2019 3:20 PM Performed by: Anselm Pancoast, PA-C Authorized by: Anselm Pancoast, PA-C   Consent:    Consent obtained:  Verbal   Consent given by:  Patient   Risks discussed:  Infection, need for additional repair, nerve damage, pain, poor cosmetic result, poor wound healing and vascular damage Anesthesia (see MAR for  exact dosages):    Anesthesia method:  Local infiltration   Local anesthetic:  Lidocaine 2% WITH epi and bupivacaine 0.5% w/o epi Laceration details:    Location:  Hand   Hand location:  L hand, dorsum   Length (cm):  1 Repair type:    Repair type:  Simple Pre-procedure details:    Preparation:  Patient was prepped and draped in usual sterile fashion and imaging obtained to evaluate for foreign bodies Exploration:    Hemostasis achieved with:  Epinephrine and direct pressure Treatment:    Area cleansed with:  Betadine and saline   Amount of cleaning:  Standard   Irrigation solution:  Sterile saline   Irrigation method:  Syringe Skin repair:    Repair method:  Sutures   Suture size:  4-0   Suture material:  Prolene   Suture technique:  Simple interrupted   Number of sutures:  3 Approximation:    Approximation:  Close Post-procedure details:    Dressing:  Non-adherent dressing and sterile dressing   Patient tolerance of procedure:  Tolerated well, no immediate complications .Marland KitchenLaceration Repair  Date/Time: 04/16/2019 3:00 PM Performed by: Anselm Pancoast, PA-C Authorized by: Anselm Pancoast, PA-C   Consent:    Consent obtained:  Verbal   Consent given by:  Patient   Risks discussed:  Infection, pain, poor cosmetic result, retained foreign body, vascular damage, poor wound healing, nerve damage and need for additional repair Anesthesia (see MAR for exact dosages):    Anesthesia method:  Local infiltration   Local anesthetic:  Lidocaine 2% WITH epi and bupivacaine 0.5% w/o epi Laceration details:    Location:  Hand   Hand location:  L palm   Length (cm):  2 Repair type:    Repair type:  Intermediate Pre-procedure details:  Preparation:  Patient was prepped and draped in usual sterile fashion and imaging obtained to evaluate for foreign bodies Exploration:    Hemostasis achieved with:  Epinephrine and direct pressure   Wound extent: vascular damage   Treatment:    Area  cleansed with:  Betadine and saline   Amount of cleaning:  Extensive   Irrigation solution:  Sterile saline   Irrigation method:  Syringe Skin repair:    Repair method:  Sutures   Suture size:  4-0   Suture material:  Prolene   Suture technique:  Simple interrupted   Number of sutures:  5 Approximation:    Approximation:  Close Post-procedure details:    Dressing:  Non-adherent dressing and sterile dressing   Patient tolerance of procedure:  Tolerated well, no immediate complications   (including critical care time)  Medications Ordered in ED Medications  bupivacaine (MARCAINE) 0.5 % injection 10 mL (10 mLs Infiltration Given 04/16/19 1527)  lidocaine-EPINEPHrine (XYLOCAINE W/EPI) 2 %-1:200000 (PF) injection 10 mL (10 mLs Infiltration Given 04/16/19 1527)  Tdap (BOOSTRIX) injection 0.5 mL (0.5 mLs Intramuscular Given 04/16/19 1521)  morphine 4 MG/ML injection 4 mg (4 mg Intravenous Given 04/16/19 1719)  ondansetron (ZOFRAN) injection 4 mg (4 mg Intravenous Given 04/16/19 1719)  morphine 4 MG/ML injection 4 mg (4 mg Intravenous Given 04/16/19 1836)  propofol (DIPRIVAN) 10 mg/mL bolus/IV push (has no administration in time range)  midazolam (VERSED) 2 MG/2ML injection (has no administration in time range)  fentaNYL (SUBLIMAZE) 250 MCG/5ML injection (has no administration in time range)  succinylcholine (ANECTINE) 200 MG/10ML syringe (has no administration in time range)  lidocaine 20 MG/ML injection (has no administration in time range)     Initial Impression / Assessment and Plan / ED Course  I have reviewed the triage vital signs and the nursing notes.  Pertinent labs & imaging results that were available during my care of the patient were reviewed by me and considered in my medical decision making (see chart for details).  Clinical Course as of Apr 15 2114  Tue Apr 16, 2019  1445 Spoke with Silvestre Gunner, PA on call for hand surgery. He came to assess the patient.  States he  will speak with Dr. Amedeo Plenty, but plan is likely for patient to go to OR for repair.  Suture wound for hemorrhage control.   [SJ]  57 RN notifies me of rebleeding from the palmar wound.  I reevaluated the patient's wounds and overall status. Hand and fingers are appropriately warm to the touch.  Sensation intact.  Full range of motion in the fingers and thumb without much difficulty and without significant pain in the thumb.  No pain with movement of the fingers.  There is swelling to the patient's hand near the palmar wound, but compartments of the hand are still soft.   [SJ]  [id number] Dr. Amedeo Plenty saw patient. States she is ok to wait for her surgery.   [SJ]    Clinical Course User Index [SJ] ,  C, PA-C       Patient presents with an injury to left hand.  Suspect localized vascular injury based on physical exam findings.  No signs of distal neurovascular compromise. No acute abnormalities on x-ray. Patient to be taken to the OR by hand specialist.   Findings and plan of care discussed with Daleen Bo, MD. Dr. Eulis Foster personally evaluated and examined this patient.  Final Clinical Impressions(s) / ED Diagnoses   Final diagnoses:  Laceration of left hand,  foreign body presence unspecified, initial encounter    ED Discharge Orders    None       Concepcion LivingJoy,  C, PA-C 04/16/19 2117    Concepcion LivingJoy,  C, PA-C 04/16/19 2117    Mancel BaleWentz, Elliott, MD 04/18/19 1754

## 2019-04-16 NOTE — Anesthesia Procedure Notes (Signed)
Procedure Name: Intubation Date/Time: 04/16/2019 11:36 PM Performed by: Valetta Fuller, CRNA Pre-anesthesia Checklist: Patient identified, Emergency Drugs available, Suction available and Patient being monitored Patient Re-evaluated:Patient Re-evaluated prior to induction Oxygen Delivery Method: Circle system utilized Preoxygenation: Pre-oxygenation with 100% oxygen Induction Type: IV induction, Rapid sequence and Cricoid Pressure applied Laryngoscope Size: Miller and 2 Grade View: Grade I Tube type: Oral Tube size: 7.5 mm Number of attempts: 1 Airway Equipment and Method: Stylet Placement Confirmation: ETT inserted through vocal cords under direct vision,  positive ETCO2 and breath sounds checked- equal and bilateral Secured at: 20 cm Tube secured with: Tape Dental Injury: Teeth and Oropharynx as per pre-operative assessment

## 2019-04-16 NOTE — Anesthesia Preprocedure Evaluation (Signed)
Anesthesia Evaluation  Patient identified by MRN, date of birth, ID band Patient awake    Reviewed: Allergy & Precautions, NPO status , Patient's Chart, lab work & pertinent test results  Airway Mallampati: I  TM Distance: >3 FB Neck ROM: Full    Dental   Pulmonary    Pulmonary exam normal        Cardiovascular Normal cardiovascular exam     Neuro/Psych Anxiety Depression    GI/Hepatic   Endo/Other    Renal/GU      Musculoskeletal   Abdominal   Peds  Hematology   Anesthesia Other Findings   Reproductive/Obstetrics                             Anesthesia Physical Anesthesia Plan  ASA: II  Anesthesia Plan: General   Post-op Pain Management:    Induction: Intravenous  PONV Risk Score and Plan: 3 and Ondansetron, Midazolam and Treatment may vary due to age or medical condition  Airway Management Planned: Oral ETT  Additional Equipment:   Intra-op Plan:   Post-operative Plan: Extubation in OR  Informed Consent: I have reviewed the patients History and Physical, chart, labs and discussed the procedure including the risks, benefits and alternatives for the proposed anesthesia with the patient or authorized representative who has indicated his/her understanding and acceptance.     Plan Discussed with: CRNA and Surgeon  Anesthesia Plan Comments:         Anesthesia Quick Evaluation  

## 2019-04-17 ENCOUNTER — Encounter (HOSPITAL_COMMUNITY): Payer: Self-pay | Admitting: Orthopedic Surgery

## 2019-04-17 MED ORDER — HYDROMORPHONE HCL 1 MG/ML IJ SOLN
0.2500 mg | INTRAMUSCULAR | Status: DC | PRN
  Administered 2019-04-17 (×2): 0.25 mg via INTRAVENOUS

## 2019-04-17 MED ORDER — MEPERIDINE HCL 25 MG/ML IJ SOLN: 6.2500 mg | INTRAMUSCULAR | Status: DC | PRN

## 2019-04-17 MED ORDER — HYDROCODONE-ACETAMINOPHEN 5-325 MG PO TABS: 1.0000 | ORAL_TABLET | ORAL | 0 refills | Status: DC | PRN

## 2019-04-17 MED ORDER — ONDANSETRON HCL 4 MG/2ML IJ SOLN
INTRAMUSCULAR | Status: AC
  Filled 2019-04-17: qty 2

## 2019-04-17 MED ORDER — ONDANSETRON HCL 4 MG/2ML IJ SOLN
INTRAMUSCULAR | Status: DC | PRN
  Administered 2019-04-17: 4 mg via INTRAVENOUS

## 2019-04-17 MED ORDER — HYDROMORPHONE HCL 1 MG/ML IJ SOLN
INTRAMUSCULAR | Status: AC
  Filled 2019-04-17: qty 1

## 2019-04-17 MED ORDER — SODIUM CHLORIDE 0.9 % IR SOLN
Status: DC | PRN
  Administered 2019-04-17: 3000 mL
  Administered 2019-04-17: 1000 mL

## 2019-04-17 MED ORDER — CEPHALEXIN 500 MG PO CAPS: 500.0000 mg | ORAL_CAPSULE | Freq: Four times a day (QID) | ORAL | 0 refills | Status: AC

## 2019-04-17 MED ORDER — ONDANSETRON HCL 4 MG/2ML IJ SOLN
4.0000 mg | Freq: Once | INTRAMUSCULAR | Status: AC | PRN
  Administered 2019-04-17: 01:00:00 4 mg via INTRAVENOUS

## 2019-04-17 NOTE — Anesthesia Postprocedure Evaluation (Signed)
Anesthesia Post Note  Patient: Meghan Walker  Procedure(s) Performed: IRRIGATION AND DEBRIDEMENT EXTREMITY (Left )     Patient location during evaluation: PACU Anesthesia Type: General Level of consciousness: awake and alert Pain management: pain level controlled Vital Signs Assessment: post-procedure vital signs reviewed and stable Respiratory status: spontaneous breathing, nonlabored ventilation, respiratory function stable and patient connected to nasal cannula oxygen Cardiovascular status: blood pressure returned to baseline and stable Postop Assessment: no apparent nausea or vomiting Anesthetic complications: no    Last Vitals:  Vitals:   04/17/19 0100 04/17/19 0114  BP: (!) 151/87 (!) 144/85  Pulse: 69 67  Resp: 17 11  Temp: 36.4 C   SpO2: 100% 94%    Last Pain:  Vitals:   04/17/19 0036  TempSrc:   PainSc: 0-No pain                 Javarious Elsayed DAVID

## 2019-04-17 NOTE — Op Note (Signed)
Operative note 04/17/2019  Zettie Pho MD  Preoperative diagnosis left thenar laceration with expanding hematoma pain and difficulty controlling blood loss with paresthesias about the index finger  Postop diagnosis laceration with contusive injury to the digital nerves no evidence of Severance.  Large hematoma.  Operative procedure #1 exploration thenar wound/left thenar space with hematoma evacuation #2 ulnar digital nerve neuroplasty left thumb #3 radial digital nerve neuroplasty left index finger #4 complex wound closure over drain left thenar space x2 separate incisions/lacerations #5 complex radial digital artery and princeps pollicis arterial exploration which was noted to be stable and intact  Roseanne Kaufman MD  Anesthesia General  Estimated blood loss less than 50 cc  Drains 1  Complications none  Description of procedure patient was seen by myself and anesthesia she was given a general anesthetic.  I extensively counseled her in the preop holding area in regards to risk and benefits of surgical intervention.  The patient had an expanding hematoma pain and decreased function.  We plan to proceed with surgical endeavors.  The patient was prepped with Hibiclens scrub followed by Betadine scrub and paint once this was complete timeout was observed.  Under general anesthetic on large stay volar thenar wound evacuated a very large hematoma and following this under 250 mm of tourniquet control performed a radial digital artery exploration and a exploration of the princeps pollicis arterial tree.  This was intact.  The radial digital nerve and the ulnar digital nerve were explored this was the ulnar digital nerve to the thumb which underwent a neuroplasty and a 4.5 loupe magnification and the radial digital nerve to the index finger which underwent a neuroplasty.  The dorsal wound was opened and hematoma evacuated debridement of muscle occurred.  There were no complicating features I  deflated the tourniquet secured hemostasis.  The muscle bleeders were treated with coagulation utilizing bipolar electrocautery.  I irrigated with 3 L of saline and performed a complex wound closure.  The patient tolerated this well.  She will be discharged home on Keflex x14 days oxycodone as needed pain return to see me in 12 to 14 days for follow-up and remove the drain in the a.m.  All questions have been encouraged and answered.  Naydene Kamrowski MD

## 2019-04-17 NOTE — Progress Notes (Signed)
MD at bedside and pulled red tube top drain.

## 2019-04-17 NOTE — Discharge Instructions (Signed)
Please elevate move massager fingers.  Please keep your bandage clean and dry until your first postop visit.  Please remove your drain in the morning and discard.  Call us for any problems.  We would like to see you back in our office in 14 days.

## 2019-04-17 NOTE — Transfer of Care (Signed)
Immediate Anesthesia Transfer of Care Note  Patient: Meghan Walker  Procedure(s) Performed: IRRIGATION AND DEBRIDEMENT EXTREMITY (Left )  Patient Location: PACU  Anesthesia Type:General  Level of Consciousness: sedated  Airway & Oxygen Therapy: Patient Spontanous Breathing  Post-op Assessment: Report given to RN and Post -op Vital signs reviewed and stable  Post vital signs: Reviewed and stable  Last Vitals:  Vitals Value Taken Time  BP 140/87 04/17/19 0037  Temp 36.4 C 04/17/19 0036  Pulse 75 04/17/19 0040  Resp 18 04/17/19 0040  SpO2 95 % 04/17/19 0040  Vitals shown include unvalidated device data.  Last Pain:  Vitals:   04/16/19 2157  TempSrc:   PainSc: Asleep         Complications: No apparent anesthesia complications

## 2019-04-18 NOTE — ED Provider Notes (Signed)
  Face-to-face evaluation   History: Laceration as cutting an avacado. Left first web space  Physical exam:Lac Left 1st web space volar entry, dorsal exit. Moderate swelling post-initial closure. No pain with passive stretching of fingers. Normal cap. Refill and sensation all fingers.  Doubt acute compartment syndrome. Stable to await  Hand Surgeon exploration/closure.  Medical screening examination/treatment/procedure(s) were conducted as a shared visit with non-physician practitioner(s) and myself.  I personally evaluated the patient during the encounter   Daleen Bo, MD 04/18/19 1753

## 2019-05-01 DIAGNOSIS — M79642 Pain in left hand: Secondary | ICD-10-CM | POA: Insufficient documentation

## 2019-05-01 DIAGNOSIS — Z5189 Encounter for other specified aftercare: Secondary | ICD-10-CM | POA: Insufficient documentation

## 2019-05-08 DIAGNOSIS — S61412A Laceration without foreign body of left hand, initial encounter: Secondary | ICD-10-CM | POA: Insufficient documentation

## 2019-05-31 ENCOUNTER — Other Ambulatory Visit: Payer: Self-pay

## 2019-05-31 ENCOUNTER — Ambulatory Visit: Payer: Self-pay | Attending: Emergency Medicine | Admitting: Occupational Therapy

## 2019-05-31 DIAGNOSIS — M79642 Pain in left hand: Secondary | ICD-10-CM | POA: Insufficient documentation

## 2019-05-31 DIAGNOSIS — R278 Other lack of coordination: Secondary | ICD-10-CM | POA: Insufficient documentation

## 2019-05-31 DIAGNOSIS — M6281 Muscle weakness (generalized): Secondary | ICD-10-CM | POA: Insufficient documentation

## 2019-05-31 DIAGNOSIS — M25642 Stiffness of left hand, not elsewhere classified: Secondary | ICD-10-CM | POA: Insufficient documentation

## 2019-05-31 NOTE — Patient Instructions (Signed)
Opposition (Active)   Touch tip of thumb to nail tip of each finger in turn, making an "O" shape. Repeat __10__ times. Do _4-6___ sessions per day.   MP Flexion (Active)   Bend thumb to touch base of little finger, keeping tip joint straight. Repeat __10-15__ times. Do _4-6___ sessions per day.       IP Flexion (Active Blocked)   Brace thumb below tip joint. Bend joint as far as possible. Repeat __10__ times. Do _4-6___ sessions per day.   Composite Extension (Active)   Bring thumb up and out in hitchhiker position.  Repeat __10-15__ times. Do _4-6___ sessions per day.  Flexor Tendon Gliding (Active Hook Fist)   With fingers and knuckles straight, bend middle and tip joints. Do not bend large knuckles. Repeat _10-15___ times. Do _4-6___ sessions per day.  MP Flexion (Active)   With back of hand on table, bend large knuckles as far as they will go, keeping small joints straight. Repeat _10-15___ times. Do __4-6__ sessions per day. Activity: Reach into a narrow container.*      Finger Flexion / Extension   With palm up, bend fingers of left hand toward palm, making a  fist. Straighten fingers, opening fist. Repeat sequence _10-15___ times per session. Do _4-6__ sessions per day. Hand Variation: Palm down   Copyright  VHI. All rights reserved.

## 2019-05-31 NOTE — Therapy (Signed)
[facility] [address] [place], [place], [address] Phone: [phone]   Fax:  [phone]  Occupational Therapy Evaluation  Patient Details  Name: Meghan Walker MRN: [id number] Date of Birth: 1971/02/02 Referring Provider (OT): Dr. Amanda PeaGramig   Encounter Date: 05/31/2019  OT End of Session - 05/31/19 1119    Visit Number  1    Number of Visits  9    Date for OT Re-Evaluation  07/30/19    Authorization Type  self pay    OT Start Time  0720    OT Stop Time  0800    OT Time Calculation (min)  40 min       Past Medical History:  Diagnosis Date  . Anxiety   . Depression   . Endometriosis   . Hemorrhoids   . Irregular menstruation   . UTI (lower urinary tract infection)     Past Surgical History:  Procedure Laterality Date  . APPENDECTOMY    . CESAREAN SECTION     X 3  . I&D EXTREMITY Left 04/16/2019   Procedure: IRRIGATION AND DEBRIDEMENT EXTREMITY;  Surgeon: Dominica SeverinGramig, William, MD;  [facility];  Service: Orthopedics;  Laterality: Left;    There were no vitals filed for this visit.  Subjective Assessment - 05/31/19 0725    Subjective   Pt cut herself on 05/17/2019    Patient Stated Goals  improve use of LUE    Currently in Pain?  Yes    Pain Score  8     Pain Location  Hand    Pain Orientation  Left    Pain Descriptors / Indicators  Aching    Pain Type  Acute pain    Pain Onset  1 to 4 weeks ago    Aggravating Factors   movement    Pain Relieving Factors  inactivity        [facility] OT Assessment - 05/31/19 0728      Assessment   Medical Diagnosis  I and D punture wound, s/p knife cut    Referring Provider (OT)  Dr. 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      ADL   ADL comments  modified independent with all basic ADLS   uses primarily RUE     IADL   Shopping  Needs to be accompanied on any shopping trip    Meal Prep  --   needs assist with simple meal prep, difficulty with chopping     Mobility   Mobility Status  Independent      Written Expression   Dominant Hand  Right      Cognition   Overall Cognitive Status  Within Functional Limits for tasks assessed      Sensation   Light Touch  Impaired by gross assessment      Coordination   Fine Motor Movements are Fluid and Coordinated  No    Coordination  impaired due to pain   unable to lift heavy items  ROM / Strength   AROM / PROM / Strength  AROM      AROM   Overall AROM   Deficits;Due to pain    Overall AROM Comments  LUE composite finger flexion 85%      Left Hand AROM   L Thumb MCP 0-60  35 Degrees    L Thumb IP 0-80  50 Degrees    L Thumb Radial ADduction/ABduction 0-55  30-45    L Thumb Opposition to Index  --   opposes all with difficulty   L Index  MCP 0-90  70 Degrees    L Long  MCP 0-90  70 Degrees    L Ring  MCP 0-90  65 Degrees    L Little  MCP 0-90  45 Degrees      Hand Function   Right Hand Grip (lbs)  48    Left Hand Grip (lbs)  8            Fluidotherapy x 9 mins to LUE for desensitization and pain relief, no adverse reactions. Pt was instructed in initial HEP. She returned demonstration.            OT Short Term Goals - 05/31/19 1128      OT SHORT TERM GOAL #1   Title  I with initial HEP.    Time  4    Period  Weeks    Status  New    Target Date  06/30/19      OT SHORT TERM GOAL #2   Title  Pt will demonstrate at least 95% composite finger flexion for increased functional use.    Time  4    Period  Weeks    Status  New      OT SHORT TERM GOAL #3   Title  Pt will verbalize understanding of  desensitization and scar massage techniques.    Time  4    Period  Weeks    Status  New      OT SHORT TERM GOAL #4   Title  Pt will resume use of LUE fro light functional activities at least 50% of the time.    Time  4    Period  Weeks    Status  New        OT Long Term Goals - 05/31/19 1130      OT LONG TERM GOAL #1   Title  Pt will demonstratem grip strength of at least 25 lbs in prep for functional use.    Time  8    Period  Weeks    Status  New    Target Date  07/30/19      OT LONG TERM GOAL #2   Title  Pt will demonstrate thumb MP flexion WFLS for ADLS/IADLs.    Time  8    Period  Weeks    Status  New      OT LONG TERM GOAL #3   Title  Pt will resume use of LUE as a non dominant assist at least 80% of the time.    Time  8    Period  Weeks    Status  New      OT LONG TERM GOAL #4   Title  Pt will report that she is able to cook and wash dishes independently using LUE to assist.    Time  8    Period  Weeks    Status  New  Plan - 05/31/19 1119    Clinical Impression Statement  Pt is a 48 y.o female s/p I and D of left hand s/p puncture wound to LUE after cutting her hand on 04/17/2019. Pt presents with the following deficits: decreased ROM, decreased strength, decreased LUE functional use, pain which impedes perfromance of ADLs/ IADLs. Pt can benefit from skilled occupational therapy to maximize pt's safety and independence with with daily activities. 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    OT Occupational Profile and History  Problem Focused Assessment - Including review of records relating to presenting problem    Occupational performance deficits (Please refer to evaluation for details):  ADL's;IADL's;Work;Leisure;Social Participation    Body Structure / Function / Physical Skills  ADL;Edema;Strength;UE functional use;Flexibility;FMC;Pain;GMC;ROM;Scar mobility;Dexterity;IADL;Sensation    Rehab Potential   Good    Clinical Decision Making  Limited treatment options, no task modification necessary    Comorbidities Affecting Occupational Performance:  May have comorbidities impacting occupational performance    Modification or Assistance to Complete Evaluation   No modification of tasks or assist necessary to complete eval    OT Frequency  1x / week    OT Duration  8 weeks   plus eval   OT Treatment/Interventions  Self-care/ADL training;Therapeutic exercise;Ultrasound;Manual Therapy;Therapeutic activities;Paraffin;Cryotherapy;Neuromuscular education;DME and/or AE instruction;Fluidtherapy;Patient/family education;Passive range of motion;Moist Heat;Contrast Bath    Plan  review HEP, add putty exercises, fluido for desensitization, consider [place]    OT Home Exercise Plan  issued A/ROM, pt is spanish speaking    Consulted and Agree with Plan of Care  Patient;Other (Comment)   interpreter via video      Patient will benefit from skilled therapeutic intervention in order to improve the following deficits and impairments:   Body Structure / Function / Physical Skills: ADL, Edema, Strength, UE functional use, Flexibility, FMC, Pain, GMC, ROM, Scar mobility, Dexterity, IADL, Sensation       Visit Diagnosis: Stiffness of left hand, not elsewhere classified  Pain in left hand  Other lack of coordination  Muscle weakness (generalized)    Problem List Patient Active Problem List   Diagnosis Date Noted  . Rectal bleeding 07/08/2014  . AP (abdominal pain) 07/08/2014  . Hemorrhoid 05/12/2014    RINE,KATHRYN 05/31/2019, 11:37 AM  [facility] [address] [place], [place], [address] Phone: [phone]   Fax:  [phone]  Name: Meghan Walker MRN: [id number] Date of Birth: 03/15/1971

## 2019-06-04 ENCOUNTER — Ambulatory Visit: Payer: Self-pay | Admitting: Occupational Therapy

## 2019-06-04 ENCOUNTER — Other Ambulatory Visit: Payer: Self-pay

## 2019-06-04 DIAGNOSIS — R278 Other lack of coordination: Secondary | ICD-10-CM

## 2019-06-04 DIAGNOSIS — M6281 Muscle weakness (generalized): Secondary | ICD-10-CM

## 2019-06-04 DIAGNOSIS — M25642 Stiffness of left hand, not elsewhere classified: Secondary | ICD-10-CM

## 2019-06-04 DIAGNOSIS — M79642 Pain in left hand: Secondary | ICD-10-CM

## 2019-06-04 NOTE — Therapy (Addendum)
[facility] [address] [place], [place], [address] Phone: [phone]   Fax:  [phone]  Occupational Therapy Treatment  Patient Details  Name: Meghan Walker MRN: [id number] Date of Birth: [birth year] Referring Provider (OT): Dr. Amedeo Plenty   Encounter Date: 06/04/2019  OT End of Session - 06/07/19 1605    Visit Number  2   Number of Visits  9    Date for OT Re-Evaluation  07/30/19    Authorization Type  self pay    OT Start Time  1020    OT Stop Time  1100    OT Time Calculation (min)  40 min    Activity Tolerance  Patient limited by pain    Behavior During Therapy  [facility] for tasks assessed/performed       Past Medical History:  Diagnosis Date  . Anxiety   . Depression   . Endometriosis   . Hemorrhoids   . Irregular menstruation   . UTI (lower urinary tract infection)     Past Surgical History:  Procedure Laterality Date  . APPENDECTOMY    . CESAREAN SECTION     X 3  . I&D EXTREMITY Left 04/16/2019   Procedure: IRRIGATION AND DEBRIDEMENT EXTREMITY;  Surgeon: Roseanne Kaufman, MD;  Location: [place];  Service: Orthopedics;  Laterality: Left;    There were no vitals filed for this visit.              Treatment: Video interpreter present Fluidotherapy x 10 mins for pain, desensitization  and stiffness, no adverse reactions. [place] 3MHZ 0.8 w/cm 2, 20% x 8 mins for scar mobilization to palmar and thenar incision Reveiwed HEP, 10-20 reps each, min v.c, see HEP first  visit-pt instructions Scar massage to palmar incision, pt was instructed to perform at home via interpreter.             OT Short Term Goals - 05/31/19 1128      OT SHORT TERM GOAL #1   Title  I with initial HEP.    Time  4    Period  Weeks    Status  New    Target Date  06/30/19      OT SHORT TERM GOAL #2   Title  Pt will demonstrate at least 95% composite finger flexion for increased functional use.    Time   4    Period  Weeks    Status  New      OT SHORT TERM GOAL #3   Title  Pt will verbalize understanding of desensitiztion and scar massage techniques.    Time  4    Period  Weeks    Status  New      OT SHORT TERM GOAL #4   Title  Pt will resume use of LUE for light functional activities at least 50% of the time.    Time  4    Period  Weeks    Status  New        OT Long Term Goals - 05/31/19 1130      OT LONG TERM GOAL #1   Title  Pt will demonstrate grip strength of at least 25 lbs in prep for functional use.    Time  8    Period  Weeks    Status  New    Target Date  07/30/19      OT LONG TERM GOAL #2   Title  Pt will demonstrate thumb MP flexion  WFLS for ADLS/IADLs.    Time  8    Period  Weeks    Status  New      OT LONG TERM GOAL #3   Title  Pt will resume use of LUE as a non dominant assist at least 80% of the time with pain less than or equal to 3/10.    Time  8    Period  Weeks    Status  New      OT LONG TERM GOAL #4   Title  Pt will report that she is able to cook and wash dishes independently using LUE to assist.    Time  8    Period  Weeks    Status  New            Plan - 06/07/19 1604    Clinical Impression Statement  Pt is progressing towards goals however she remains limited by significant pain.    OT Occupational Profile and History  Problem Focused Assessment - Including review of records relating to presenting problem    Occupational performance deficits (Please refer to evaluation for details):  ADL's;IADL's;Work;Leisure;Social Participation    Body Structure / Function / Physical Skills  ADL;Edema;Strength;UE functional use;Flexibility;FMC;Pain;GMC;ROM;Scar mobility;Dexterity;IADL;Sensation    Rehab Potential  Good    Clinical Decision Making  Limited treatment options, no task modification necessary    Comorbidities Affecting Occupational Performance:  May have comorbidities impacting occupational performance    Modification or Assistance  to Complete Evaluation   No modification of tasks or assist necessary to complete eval    OT Frequency  1x / week    OT Duration  8 weeks   plus eval   OT Treatment/Interventions  Self-care/ADL training;Therapeutic exercise;Ultrasound;Manual Therapy;Therapeutic activities;Paraffin;Cryotherapy;Neuromuscular education;DME and/or AE instruction;Fluidtherapy;Patient/family education;Passive range of motion;Moist Heat;Contrast Bath    Plan  review HEP, add putty exercises, fluido for desensitization, [place]    OT Home Exercise Plan  issued A/ROM, pt is spanish speaking    Consulted and Agree with Plan of Care  Patient;Other (Comment)   interpreter via video      Patient will benefit from skilled therapeutic intervention in order to improve the following deficits and impairments:   Body Structure / Function / Physical Skills: ADL, Edema, Strength, UE functional use, Flexibility, FMC, Pain, GMC, ROM, Scar mobility, Dexterity, IADL, Sensation       Visit Diagnosis: Stiffness of left hand, not elsewhere classified  Pain in left hand  Other lack of coordination  Muscle weakness (generalized)    Problem List Patient Active Problem List   Diagnosis Date Noted  . Rectal bleeding 07/08/2014  . AP (abdominal pain) 07/08/2014  . Hemorrhoid 05/12/2014    Aadan Chenier 06/07/2019, 4:07 PM Keene Breath, OTR/L [phone] Phone: [phone] 4:07 PM 06/07/19 [facility] [address] [place], [place], [address] Phone: [phone]   Fax:  [phone]  Name: Meghan Walker MRN: [id number] Date of Birth: Nov 20, 1970

## 2019-06-11 ENCOUNTER — Other Ambulatory Visit: Payer: Self-pay

## 2019-06-11 ENCOUNTER — Ambulatory Visit: Payer: Self-pay | Admitting: Occupational Therapy

## 2019-06-11 DIAGNOSIS — M6281 Muscle weakness (generalized): Secondary | ICD-10-CM

## 2019-06-11 DIAGNOSIS — M79642 Pain in left hand: Secondary | ICD-10-CM

## 2019-06-11 DIAGNOSIS — M25642 Stiffness of left hand, not elsewhere classified: Secondary | ICD-10-CM

## 2019-06-11 DIAGNOSIS — R278 Other lack of coordination: Secondary | ICD-10-CM

## 2019-06-11 NOTE — Therapy (Signed)
[facility] [address] [place], [place], [address] Phone: [phone]   Fax:  [phone]  Occupational Therapy Treatment  Patient Details  Name: Meghan Walker MRN: [id number] Date of Birth: 05-20-1971 Referring Provider (OT): Dr. Amanda PeaGramig   Encounter Date: 06/11/2019  OT End of Session - 06/11/19 1701    Visit Number  3    Number of Visits  9    Date for OT Re-Evaluation  07/30/19    Authorization Type  self pay    OT Start Time  1617    OT Stop Time  1703    OT Time Calculation (min)  46 min    Activity Tolerance  Patient tolerated treatment well    Behavior During Therapy  [facility] for tasks assessed/performed       Past Medical History:  Diagnosis Date  . Anxiety   . Depression   . Endometriosis   . Hemorrhoids   . Irregular menstruation   . UTI (lower urinary tract infection)     Past Surgical History:  Procedure Laterality Date  . APPENDECTOMY    . CESAREAN SECTION     X 3  . I&D EXTREMITY Left 04/16/2019   Procedure: IRRIGATION AND DEBRIDEMENT EXTREMITY;  Surgeon: Dominica SeverinGramig, William, MD;  [facility];  Service: Orthopedics;  Laterality: Left;    There were no vitals filed for this visit.  Subjective Assessment - 06/11/19 1654    Subjective   Patient indicates she is massaging her hand several times daily, and she is using it to sweep, wash dishes, etc.    Patient is accompanied by:  Interpreter    Patient Stated Goals  improve use of LUE    Currently in Pain?  Yes    Pain Score  6     Pain Location  Hand    Pain Orientation  Left    Pain Descriptors / Indicators  Pins and needles;Numbness    Pain Type  Acute pain    Pain Onset  1 to 4 weeks ago    Pain Frequency  Constant    Aggravating Factors   palm, movement    Pain Relieving Factors  Heat                   OT Treatments/Exercises (OP) - 06/11/19 0001      ADLs   Grooming  Patient stated that she was able to  braid her own hair today, but it is challenging    Home Maintenance  Patient indicates that she is attempting to use her hand to wash dishes and sweep floor, but with increased tension for grasp she experiences pain.        Modalities   Modalities  Fluidotherapy;Ultrasound      Ultrasound   Ultrasound Location  palm    Ultrasound Parameters  3mhz, 0.8 w/cm2, x 8min    Ultrasound Goals  Pain;Other (Comment)   scar mobilization     LUE Fluidotherapy   Number Minutes Fluidotherapy  10 Minutes    LUE Fluidotherapy Location  Hand;Wrist    Comments  to reduce pain, stiffness      Manual Therapy   Manual Therapy  Soft tissue mobilization    Manual therapy comments  Patient remains tender over scar- reports pins and needles discomfort with manual therapy (6/10)  resolved after Ultrasound             OT Education - 06/11/19 1701    Education Details  soft tissue mobilization palm    Person(s) Educated  Patient    Methods  Explanation;Demonstration    Comprehension  Verbalized understanding;Returned demonstration       OT Short Term Goals - 05/31/19 1128      OT SHORT TERM GOAL #1   Title  I with initial HEP.    Time  4    Period  Weeks    Status  New    Target Date  06/30/19      OT SHORT TERM GOAL #2   Title  Pt will demonstrate at least 95% composite finger flexion for increased functional use.    Time  4    Period  Weeks    Status  New      OT SHORT TERM GOAL #3   Title  Pt will verbalize understanding of desensitiztion and scar massage techniques.    Time  4    Period  Weeks    Status  New      OT SHORT TERM GOAL #4   Title  Pt will resume use of LUE for light functional activities at least 50% of the time.    Time  4    Period  Weeks    Status  New        OT Long Term Goals - 05/31/19 1130      OT LONG TERM GOAL #1   Title  Pt will demonstrate grip strength of at least 25 lbs in prep for functional use.    Time  8    Period  Weeks    Status  New     Target Date  07/30/19      OT LONG TERM GOAL #2   Title  Pt will demonstrate thumb MP flexion WFLS for ADLS/IADLs.    Time  8    Period  Weeks    Status  New      OT LONG TERM GOAL #3   Title  Pt will resume use of LUE as a non dominant assist at least 80% of the time with pain less than or equal to 3/10.    Time  8    Period  Weeks    Status  New      OT LONG TERM GOAL #4   Title  Pt will report that she is able to cook and wash dishes independently using LUE to assist.    Time  8    Period  Weeks    Status  New            Plan - 06/11/19 1702    Clinical Impression Statement  Patient is dilligent with attempting to use hand functionally, and is still experiencing pain with grip, and over scar    OT Frequency  1x / week    OT Duration  8 weeks    OT Treatment/Interventions  Self-care/ADL training;Therapeutic exercise;Ultrasound;Manual Therapy;Therapeutic activities;Paraffin;Cryotherapy;Neuromuscular education;DME and/or AE instruction;Fluidtherapy;Patient/family education;Passive range of motion;Moist Heat;Contrast Bath    Plan  review HEP, add putty exercises, fluido for desensitization, [place]    OT Home Exercise Plan  issued A/ROM, pt is spanish speaking    Consulted and Agree with Plan of Care  Patient;Other (Comment)       Patient will benefit from skilled therapeutic intervention in order to improve the following deficits and impairments:           Visit Diagnosis: Stiffness of left hand, not elsewhere classified  Pain in left hand  Other  lack of coordination  Muscle weakness (generalized)    Problem List Patient Active Problem List   Diagnosis Date Noted  . Rectal bleeding 07/08/2014  . AP (abdominal pain) 07/08/2014  . Hemorrhoid 05/12/2014    Meghan Walker, OTR/L 06/11/2019, 5:04 PM  [facility] [address] [place], [place], [address] Phone: [phone]   Fax:   [phone]  Name: Meghan Walker MRN: [id number] Date of Birth: 02-25-1971

## 2019-06-20 ENCOUNTER — Ambulatory Visit: Payer: Self-pay | Admitting: Occupational Therapy

## 2019-06-21 ENCOUNTER — Encounter: Payer: Self-pay | Admitting: Occupational Therapy

## 2019-06-21 ENCOUNTER — Ambulatory Visit: Payer: Self-pay | Admitting: Occupational Therapy

## 2019-06-21 ENCOUNTER — Encounter

## 2019-06-21 ENCOUNTER — Other Ambulatory Visit: Payer: Self-pay

## 2019-06-21 DIAGNOSIS — R278 Other lack of coordination: Secondary | ICD-10-CM

## 2019-06-21 DIAGNOSIS — M25642 Stiffness of left hand, not elsewhere classified: Secondary | ICD-10-CM

## 2019-06-21 DIAGNOSIS — M6281 Muscle weakness (generalized): Secondary | ICD-10-CM

## 2019-06-21 DIAGNOSIS — M79642 Pain in left hand: Secondary | ICD-10-CM

## 2019-06-21 NOTE — Therapy (Signed)
[facility] [address] [place], [place], [address] Phone: [phone]   Fax:  [phone]  Occupational Therapy Treatment  Patient Details  Name: Meghan Walker MRN: [id number] Date of Birth: 1970/08/31 Referring Provider (OT): Dr. Amedeo Plenty   Encounter Date: 06/21/2019  OT End of Session - 06/21/19 1615    Visit Number  4    Number of Visits  9    Date for OT Re-Evaluation  07/30/19    Authorization Type  self pay    OT Start Time  1402    OT Stop Time  1450    OT Time Calculation (min)  48 min    Activity Tolerance  Patient tolerated treatment well    Behavior During Therapy  [facility] for tasks assessed/performed       Past Medical History:  Diagnosis Date  . Anxiety   . Depression   . Endometriosis   . Hemorrhoids   . Irregular menstruation   . UTI (lower urinary tract infection)     Past Surgical History:  Procedure Laterality Date  . APPENDECTOMY    . CESAREAN SECTION     X 3  . I&D EXTREMITY Left 04/16/2019   Procedure: IRRIGATION AND DEBRIDEMENT EXTREMITY;  Surgeon: Roseanne Kaufman, MD;  Location: [place];  Service: Orthopedics;  Laterality: Left;    There were no vitals filed for this visit.  Subjective Assessment - 06/21/19 1609    Subjective   Patient reports pain 4/10 at beginning of session, indicating pain increases with fisting    Patient is accompanied by:  Interpreter   Michaelle Birks [phone]   Currently in Pain?  Yes    Pain Score  4     Pain Location  Hand    Pain Orientation  Left    Pain Descriptors / Indicators  Pins and needles    Pain Type  Acute pain    Pain Onset  1 to 4 weeks ago    Pain Frequency  Intermittent    Aggravating Factors   making a fist    Pain Relieving Factors  heat                   OT Treatments/Exercises (OP) - 06/21/19 0001      ADLs   ADL Comments  Patient reports she is working to use her hand all day - for bathing, dressing, to fix her  hair, to wash dishes.  She reports pain is better overall, but end range flexion still painful.        Hand Exercises   Other Hand Exercises  Reviewed previously issued hand exercises.  Patient needed only minimal cueing for technique for thumb - IP flexion, thumb extension, and IP fisting.      Other Hand Exercises  Putty exercises - pink - see patient instructions      Sensation Exercises   Desensitization  Discussed and demonstrated desensitization techniques for patient in addition to her massage.       LUE Fluidotherapy   Number Minutes Fluidotherapy  10 Minutes    LUE Fluidotherapy Location  Hand;Wrist    Comments  pain reduction, decrease stiffness             OT Education - 06/21/19 1615    Education Details  reviewed HEP, red putty exercises, desensitization techniques    Person(s) Educated  Patient    Methods  Explanation;Demonstration    Comprehension  Verbalized understanding;Returned demonstration  OT Short Term Goals - 06/21/19 1619      OT SHORT TERM GOAL #1   Title  I with initial HEP.    Status  Achieved      OT SHORT TERM GOAL #2   Title  Pt will demonstrate at least 95% composite finger flexion for increased functional use.    Status  Achieved      OT SHORT TERM GOAL #3   Title  Pt will verbalize understanding of desensitiztion and scar massage techniques.    Status  On-going      OT SHORT TERM GOAL #4   Title  Pt will resume use of LUE for light functional activities at least 50% of the time.    Status  Achieved        OT Long Term Goals - 06/21/19 1619      OT LONG TERM GOAL #1   Title  Pt will demonstrate grip strength of at least 25 lbs in prep for functional use.    Status  Achieved            Plan - 06/21/19 1616    Clinical Impression Statement  Patient is doing well, and has met most short term goals, and one long term goal.  She is exercising and attempting to use left hand functionally daily.  She is making excellent  progress.    OT Frequency  1x / week    OT Duration  8 weeks    OT Treatment/Interventions  Self-care/ADL training;Therapeutic exercise;Ultrasound;Manual Therapy;Therapeutic activities;Paraffin;Cryotherapy;Neuromuscular education;DME and/or AE instruction;Fluidtherapy;Patient/family education;Passive range of motion;Moist Heat;Contrast Bath    Plan  continue to address composite flexion, thumb isolated movement, desensitization, and hand strengthening for functional activity    OT Home Exercise Plan  Hand exercises AROM, putty exercises for strengthening    Consulted and Agree with Plan of Care  Patient       Patient will benefit from skilled therapeutic intervention in order to improve the following deficits and impairments:           Visit Diagnosis: Stiffness of left hand, not elsewhere classified  Pain in left hand  Other lack of coordination  Muscle weakness (generalized)    Problem List Patient Active Problem List   Diagnosis Date Noted  . Rectal bleeding 07/08/2014  . AP (abdominal pain) 07/08/2014  . Hemorrhoid 05/12/2014    Mariah Milling, OTR/L 06/21/2019, 4:21 PM  [facility] [address] [place], [place], [address] Phone: [phone]   Fax:  [phone]  Name: Meghan Walker MRN: [id number] Date of Birth: March 13, 1971

## 2019-06-21 NOTE — Patient Instructions (Signed)
11. Grip Strengthening (Resistive Putty)   Squeeze putty using thumb and all fingers. Repeat 10 times. Do 1-2 sessions per day.   Extension (Assistive Putty)   Roll putty back and forth, being sure to use all fingertips. Repeat 3 times. Do 1-2 sessions per day.  Then pinch as below.   Palmar Pinch Strengthening (Resistive Putty)   Pinch putty between thumb and each fingertip in turn after rolling out    Finger and Thumb Extension (Resistive Putty)   With thumb and all fingers in center of putty donut, stretch out. Repeat 5-10 times. Do 1-2 sessions per day.      IP Fisting (Resistive Putty)   Keeping knuckles straight, bend fingertips to squeeze putty. Repeat 10 times. Do 1-2 sessions per day.  MP Flexion (Resistive Putty)   Bending only at large knuckles, press putty down against thumb. Keep fingertips straight. Repeat 10 times. Do 1-2 sessions per day.   Lateral Pinch Strengthening (Resistive Putty)    Squeeze between thumb and side of each finger in turn. Repeat 10 times. Do 1-2 sessions per day.   FINGERS: Extension (Putty)    Open hand and fingers to flatten putty. 10 reps per set, 1-2 sets per day, 5-6  days per week    Adduction (Resistive Putty)    Press between 2 fingers and pull putty anchored with other hand. Repeat with all fingers. Repeat 10  times. Do 1-2 sessions per day.   FINGERS: Intrinsic (Putty)    Flatten putty by separating fingers. Keep fingers straight. 10  reps per set, 1-2 sets per day, 5-6 days per week    Thumb Adduction (Resistive Putty)    Press putty with thumb against index finger. Keep all fingers straight. Repeat 10 times. Do 1-2 sessions per day.    Thumb Extensors    Straighten right thumb inside putty loop anchored by fingers. Repeat 10 times. Do 1-2 sessions per day. Activity: Shoot marbles using thumb.*

## 2019-06-28 ENCOUNTER — Ambulatory Visit: Payer: Self-pay | Admitting: Occupational Therapy

## 2019-07-02 ENCOUNTER — Other Ambulatory Visit: Payer: Self-pay

## 2019-07-02 ENCOUNTER — Ambulatory Visit: Payer: Self-pay | Attending: Emergency Medicine | Admitting: Occupational Therapy

## 2019-07-02 DIAGNOSIS — M79642 Pain in left hand: Secondary | ICD-10-CM | POA: Insufficient documentation

## 2019-07-02 DIAGNOSIS — M6281 Muscle weakness (generalized): Secondary | ICD-10-CM | POA: Insufficient documentation

## 2019-07-02 DIAGNOSIS — M25642 Stiffness of left hand, not elsewhere classified: Secondary | ICD-10-CM | POA: Insufficient documentation

## 2019-07-02 DIAGNOSIS — R278 Other lack of coordination: Secondary | ICD-10-CM | POA: Insufficient documentation

## 2019-07-02 NOTE — Therapy (Signed)
[facility] [address] [place], [place], [address] Phone: [phone]   Fax:  [phone]  Occupational Therapy Treatment  Patient Details  Name: Meghan Walker MRN: [id number] Date of Birth: 01/29/1971 Referring Provider (OT): Dr. Amanda Pea   Encounter Date: 07/02/2019  OT End of Session - 07/02/19 0735    Visit Number  5    Number of Visits  9    Date for OT Re-Evaluation  07/30/19    Authorization Type  self pay    OT Start Time  0729    OT Stop Time  0800    OT Time Calculation (min)  31 min    Activity Tolerance  Patient tolerated treatment well    Behavior During Therapy  [facility] for tasks assessed/performed       Past Medical History:  Diagnosis Date  . Anxiety   . Depression   . Endometriosis   . Hemorrhoids   . Irregular menstruation   . UTI (lower urinary tract infection)     Past Surgical History:  Procedure Laterality Date  . APPENDECTOMY    . CESAREAN SECTION     X 3  . I&D EXTREMITY Left 04/16/2019   Procedure: IRRIGATION AND DEBRIDEMENT EXTREMITY;  Surgeon: Dominica Severin, MD;  [facility]: [facility];  Service: Orthopedics;  Laterality: Left;    There were no vitals filed for this visit.  Subjective Assessment - 07/02/19 0730    Subjective   Patient reports pain 4/10 at beginning of session, indicating pain increases with fisting    Patient is accompanied by:  Interpreter   Earnestine Mealing [phone]   Currently in Pain?  Yes    Pain Score  6     Pain Location  Hand    Pain Orientation  Left    Pain Descriptors / Indicators  Aching    Pain Type  Acute pain    Pain Onset  1 to 4 weeks ago    Pain Frequency  Intermittent    Aggravating Factors   overuse    Pain Relieving Factors  heat                  LUE Fluidotherapy   Number Minutes Fluidotherapy  9 Minutes    LUE Fluidotherapy Location  Hand;Wrist    Comments  pain reduction, decrease stiffness      A/ROM and P/ROM thumb  flexion, followed by opposition. Scar massage to palm and therapist reviewed with pt importance of performing scar massage and desensitization at home Gripper level 1 to pick up 1 inch blocks, for sustained grip, min difficulty/ v.c Crumpling washcloth in hand then performing finger extension to flatten x 10 reps for increased ROM.            OT Short Term Goals - 06/21/19 1619      OT SHORT TERM GOAL #1   Title  I with initial HEP.    Status  Achieved      OT SHORT TERM GOAL #2   Title  Pt will demonstrate at least 95% composite finger flexion for increased functional use.    Status  Achieved      OT SHORT TERM GOAL #3   Title  Pt will verbalize understanding of desensitiztion and scar massage techniques.    Status  On-going      OT SHORT TERM GOAL #4   Title  Pt will resume use of LUE for light functional activities at least 50% of the  time.    Status  Achieved        OT Long Term Goals - 07/02/19 0738      OT LONG TERM GOAL #1   Title  Pt will demonstrate grip strength of at least 25 lbs in prep for functional use.    Time  8    Period  Weeks    Status  Achieved      OT LONG TERM GOAL #2   Title  Pt will demonstrate thumb MP flexion WFLS for ADLS/IADLs.    Time  8    Period  Weeks    Status  On-going      OT LONG TERM GOAL #3   Title  Pt will resume use of LUE as a non dominant assist at least 80% of the time with pain less than or equal to 3/10.    Time  8    Period  Weeks    Status  On-going      OT LONG TERM GOAL #4   Title  Pt will report that she is able to cook and wash dishes independently using LUE to assist.    Time  8    Period  Weeks    Status  On-going            Plan - 07/02/19 0736    Clinical Impression Statement  Pt is progressing towards goals however she remains limited by pain    OT Occupational Profile and History  Problem Focused Assessment - Including review of records relating to presenting problem    Occupational  performance deficits (Please refer to evaluation for details):  ADL's;IADL's;Work;Leisure;Social Participation    Body Structure / Function / Physical Skills  ADL;Edema;Strength;UE functional use;Flexibility;FMC;Pain;GMC;ROM;Scar mobility;Dexterity;IADL;Sensation    Rehab Potential  Good    Clinical Decision Making  Limited treatment options, no task modification necessary    Comorbidities Affecting Occupational Performance:  May have comorbidities impacting occupational performance    Modification or Assistance to Complete Evaluation   No modification of tasks or assist necessary to complete eval    OT Frequency  1x / week    OT Duration  8 weeks   plus eval   OT Treatment/Interventions  Self-care/ADL training;Therapeutic exercise;Ultrasound;Manual Therapy;Therapeutic activities;Paraffin;Cryotherapy;Neuromuscular education;DME and/or AE instruction;Fluidtherapy;Patient/family education;Passive range of motion;Moist Heat;Contrast Bath    Plan  A/ROM, gentle strengthening, fluido for desensitization, [place]    OT Home Exercise Plan  issued A/ROM, pt is spanish speaking    Consulted and Agree with Plan of Care  Patient;Other (Comment)   interpreter via video      Patient will benefit from skilled therapeutic intervention in order to improve the following deficits and impairments:   Body Structure / Function / Physical Skills: ADL, Edema, Strength, UE functional use, Flexibility, FMC, Pain, GMC, ROM, Scar mobility, Dexterity, IADL, Sensation       Visit Diagnosis: Stiffness of left hand, not elsewhere classified  Pain in left hand  Other lack of coordination  Muscle weakness (generalized)    Problem List Patient Active Problem List   Diagnosis Date Noted  . Rectal bleeding 07/08/2014  . AP (abdominal pain) 07/08/2014  . Hemorrhoid 05/12/2014    RINE,KATHRYN 07/02/2019, 7:41 AM  [facility] [address] [place], [place], [address] Phone: [phone]   Fax:  [phone]  Name: Meghan Walker MRN: [id number] Date of Birth: [birth year]

## 2019-07-09 ENCOUNTER — Encounter: Payer: Self-pay | Admitting: *Deleted

## 2019-07-10 ENCOUNTER — Other Ambulatory Visit: Payer: Self-pay

## 2019-07-10 ENCOUNTER — Ambulatory Visit: Payer: Self-pay | Admitting: Occupational Therapy

## 2019-07-10 DIAGNOSIS — M25642 Stiffness of left hand, not elsewhere classified: Secondary | ICD-10-CM

## 2019-07-10 NOTE — Patient Instructions (Addendum)
Desensitization Techniques  Perform these exercises ever 2 hours for 15 minute sessions.  Progress to the next exercises when the exercises you are doing become easy.  1)  Using light pressure, rub the various textures along with the hypersensitive area:  A.  [place]  C.  Wool  G.  Cotton material  D.  Terry cloth  2)  With the same textures use a firmer pressure. 3)  Use a hand held vibrator and massage along the sensitive area. 4)  With a small dowel rod, eraser on a pencil or base of an ink pen tap along the sensitive area. 5)  Use an empty roll-on deodorant bottle to roll along the sensitive area. 6)  Place your hand/forearm in separate containers of the following items:  A.  Sand  D.  Dry lentil beans  B.  Dry Rice  E.  Dry kidney beans  C.  Ball bearings F.  Dry pinto beans  Scar Massage Purpose: To soften/smooth scar tissue.   To desensitize sensitive areas after surgery.   To mechanically break up inner scar tissue, adhesions, therefore allowing freer        movement of injured tendons and muscle.  Technique: Use a cream to massage with, as it insures a smooth gliding motion and avoids irritation    caused by rubbing skin to skin.  Cream is preferred over a lotion.   May begin as soon as any suture areas are healed.   Apply a firm, steady pressure with your finger-tip, pulling the skin in a circular motion over the   scarred area.  Do not rub.     DIP Flexion (Active)    Doble las puntas de los dedos hasta tocar la base de los dedos. Mantenga los nudillos [employer]. Repita ____ veces. Haga ____ sesiones por da.  Copyright  VHI. All rights reserved.   Abduction / Adduction (Active)    Palma plana sobre la mesa, separe los dedos y luego jntelos lo ms que pueda. Repita ____ veces. Haga ____ sesiones por da.  Copyright  VHI. All rights reserved.  Finger Flexors    Keeping right fingertips straight, press  putty toward base of palm. Repeat ____ times. Do ____ sessions per day. Activity: Squeeze flour sifter, plastic squeeze bottles, [place] baster, juice from fruit.*  Copyright  VHI. All rights reserved.

## 2019-07-10 NOTE — Therapy (Signed)
[facility] [address] [place], [place], [address] Phone: [phone]   Fax:  [phone]  Occupational Therapy Treatment  Patient Details  Name: Meghan Walker MRN: [id number] Date of Birth: March 06, 1971 Referring Provider (OT): Dr. Amedeo Plenty   Encounter Date: 07/10/2019  OT End of Session - 07/10/19 1817    Visit Number  6    Number of Visits  9    Date for OT Re-Evaluation  07/30/19    Authorization Type  self pay    OT Start Time  1702    OT Stop Time  1744    OT Time Calculation (min)  42 min    Activity Tolerance  Patient tolerated treatment well    Behavior During Therapy  [facility] for tasks assessed/performed       Past Medical History:  Diagnosis Date  . Anxiety   . Depression   . Endometriosis   . Hemorrhoids   . Irregular menstruation   . UTI (lower urinary tract infection)     Past Surgical History:  Procedure Laterality Date  . APPENDECTOMY    . CESAREAN SECTION     X 3  . I&D EXTREMITY Left 04/16/2019   Procedure: IRRIGATION AND DEBRIDEMENT EXTREMITY;  Surgeon: Roseanne Kaufman, MD;  Location: [facility];  Service: Orthopedics;  Laterality: Left;    There were no vitals filed for this visit.  Subjective Assessment - 07/10/19 1811    Subjective   I still have pain, but my hand is moving more    Patient Stated Goals  improve use of LUE/hand and reduce pain    Currently in Pain?  Yes    Pain Score  4     Pain Location  Hand    Pain Orientation  Left    Pain Descriptors / Indicators  Tightness    Pain Type  Acute pain    Pain Frequency  Intermittent    Aggravating Factors   grasp    Pain Relieving Factors  rest    Multiple Pain Sites  No                   OT Treatments/Exercises (OP) - 07/10/19 0001      Hand Exercises   Other Hand Exercises  reviewed HEP and added digit ROM exercises, reviewed desensitization, completed scar massage and PROM to wrist and hand with focus  on left index finger - noted improved flexion following PROM      hand exercises continued:  Patient completed hand mobility exercises with min cues       OT Education - 07/10/19 1815    Education Details  reviewed progress to date, HEP and desensitization through computer translation    Person(s) Educated  Patient    Methods  Explanation;Demonstration;Handout    Comprehension  Verbalized understanding;Returned demonstration       OT Short Term Goals - 06/21/19 1619      OT SHORT TERM GOAL #1   Title  I with initial HEP.    Status  Achieved      OT SHORT TERM GOAL #2   Title  Pt will demonstrate at least 95% composite finger flexion for increased functional use.    Status  Achieved      OT SHORT TERM GOAL #3   Title  Pt will verbalize understanding of desensitiztion and scar massage techniques.    Status  On-going      OT SHORT TERM GOAL #4  Title  Pt will resume use of LUE for light functional activities at least 50% of the time.    Status  Achieved        OT Long Term Goals - 07/02/19 2423      OT LONG TERM GOAL #1   Title  Pt will demonstrate grip strength of at least 25 lbs in prep for functional use.    Time  8    Period  Weeks    Status  Achieved      OT LONG TERM GOAL #2   Title  Pt will demonstrate thumb MP flexion WFLS for ADLS/IADLs.    Time  8    Period  Weeks    Status  On-going      OT LONG TERM GOAL #3   Title  Pt will resume use of LUE as a non dominant assist at least 80% of the time with pain less than or equal to 3/10.    Time  8    Period  Weeks    Status  On-going      OT LONG TERM GOAL #4   Title  Pt will report that she is able to cook and wash dishes independently using LUE to assist.    Time  8    Period  Weeks    Status  On-going            Plan - 07/10/19 1817    Clinical Impression Statement  Pt is progressing towards goals, she is encouraged to complete mobilizaiton activities to prevent stiffness    Occupational  performance deficits (Please refer to evaluation for details):  ADL's;IADL's;Work;Leisure;Social Participation    Body Structure / Function / Physical Skills  ADL;Edema;Strength;UE functional use;Flexibility;FMC;Pain;GMC;ROM;Scar mobility;Dexterity;IADL;Sensation    Rehab Potential  Good    OT Frequency  1x / week    OT Treatment/Interventions  Self-care/ADL training;Therapeutic exercise;Ultrasound;Manual Therapy;Therapeutic activities;Paraffin;Cryotherapy;Neuromuscular education;DME and/or AE instruction;Fluidtherapy;Patient/family education;Passive range of motion;Moist Heat;Contrast Bath    Plan  A/ROM, gentle strengthening, fluido for desensitization, [place]    Consulted and Agree with Plan of Care  Patient       Patient will benefit from skilled therapeutic intervention in order to improve the following deficits and impairments:   Body Structure / Function / Physical Skills: ADL, Edema, Strength, UE functional use, Flexibility, FMC, Pain, GMC, ROM, Scar mobility, Dexterity, IADL, Sensation       Visit Diagnosis: Stiffness of left hand, not elsewhere classified    Problem List Patient Active Problem List   Diagnosis Date Noted  . Rectal bleeding 07/08/2014  . AP (abdominal pain) 07/08/2014  . Hemorrhoid 05/12/2014    Barrie Lyme, OTR/L 07/10/2019, 6:19 PM  [facility] [address] [place], [place], [address] Phone: [phone]   Fax:  [phone]  Name: Meghan Walker MRN: [id number] Date of Birth: Jun 30, 1971

## 2019-07-15 ENCOUNTER — Other Ambulatory Visit: Payer: Self-pay

## 2019-07-15 ENCOUNTER — Ambulatory Visit: Payer: Self-pay | Admitting: Occupational Therapy

## 2019-07-15 DIAGNOSIS — M25642 Stiffness of left hand, not elsewhere classified: Secondary | ICD-10-CM

## 2019-07-15 DIAGNOSIS — M79642 Pain in left hand: Secondary | ICD-10-CM

## 2019-07-15 DIAGNOSIS — R278 Other lack of coordination: Secondary | ICD-10-CM

## 2019-07-15 DIAGNOSIS — M6281 Muscle weakness (generalized): Secondary | ICD-10-CM

## 2019-07-15 NOTE — Therapy (Signed)
[facility] [address] [place], [place], [address] Phone: [phone]   Fax:  [phone]  Occupational Therapy Treatment  Patient Details  Name: Meghan Walker MRN: [id number] Date of Birth: [birth year] Referring Provider (OT): Dr. Amanda Pea   Encounter Date: 07/15/2019  OT End of Session - 07/15/19 1900    Visit Number  7    Number of Visits  9    Date for OT Re-Evaluation  07/30/19    Authorization Type  self pay    OT Start Time  1758    OT Stop Time  1832    OT Time Calculation (min)  34 min    Activity Tolerance  Patient tolerated treatment well    Behavior During Therapy  [facility] for tasks assessed/performed       Past Medical History:  Diagnosis Date  . Anxiety   . Depression   . Endometriosis   . Hemorrhoids   . Irregular menstruation   . UTI (lower urinary tract infection)     Past Surgical History:  Procedure Laterality Date  . APPENDECTOMY    . CESAREAN SECTION     X 3  . I&D EXTREMITY Left 04/16/2019   Procedure: IRRIGATION AND DEBRIDEMENT EXTREMITY;  Surgeon: Dominica Severin, MD;  Location: [facility];  Service: Orthopedics;  Laterality: Left;    There were no vitals filed for this visit.  Subjective Assessment - 07/15/19 1801    Patient is accompanied by:  Interpreter   Theador Hawthorne [phone]   Patient Stated Goals  improve use of LUE/hand and reduce pain    Currently in Pain?  No/denies    Pain Score  0-No pain                   OT Treatments/Exercises (OP) - 07/15/19 0001      ADLs   ADL Comments  Patient has returned to working at [employer] - and is working about 8 hours / day in [employer].  Patient is cautious, but reports using hand 80% time, and pain is less overall, but when she lifts something heavy, or cold - she notices increased pain.        Ultrasound   Ultrasound Location  palm, dorsum of hand at web space    Ultrasound Parameters  , 0.8 w/cm2, x 8 min     Ultrasound Goals  Edema;Pain      Manual Therapy   Manual Therapy  Soft tissue mobilization    Manual therapy comments  scar massage cross frictional, and edema massage.  Educated patient and she was able to replicate training             OT Education - 07/15/19 1900    Education Details  scar massage - cross frictional, hand warmersto reduce cold/pain    Person(s) Educated  Patient    Methods  Explanation;Demonstration    Comprehension  Verbalized understanding;Returned demonstration       OT Short Term Goals - 06/21/19 1619      OT SHORT TERM GOAL #1   Title  I with initial HEP.    Status  Achieved      OT SHORT TERM GOAL #2   Title  Pt will demonstrate at least 95% composite finger flexion for increased functional use.    Status  Achieved      OT SHORT TERM GOAL #3   Title  Pt will verbalize understanding of desensitiztion and scar massage techniques.  Status  On-going      OT SHORT TERM GOAL #4   Title  Pt will resume use of LUE for light functional activities at least 50% of the time.    Status  Achieved        OT Long Term Goals - 07/15/19 1805      OT LONG TERM GOAL #3   Title  Pt will resume use of LUE as a non dominant assist at least 80% of the time with pain less than or equal to 3/10.            Plan - 07/15/19 1901    Clinical Impression Statement  Patient continues with steady progress. Significant decrease in pain this week.    OT Frequency  1x / week    OT Duration  8 weeks    OT Treatment/Interventions  Self-care/ADL training;Therapeutic exercise;Ultrasound;Manual Therapy;Therapeutic activities;Paraffin;Cryotherapy;Neuromuscular education;DME and/or AE instruction;Fluidtherapy;Patient/family education;Passive range of motion;Moist Heat;Contrast Bath    Plan  A/ROM, gentle strengthening, fluido for desensitization, [place]    OT Home Exercise Plan  issued A/ROM, pt is spanish speaking    Consulted and Agree with Plan of Care  Patient        Patient will benefit from skilled therapeutic intervention in order to improve the following deficits and impairments:           Visit Diagnosis: Stiffness of left hand, not elsewhere classified  Other lack of coordination  Pain in left hand  Muscle weakness (generalized)    Problem List Patient Active Problem List   Diagnosis Date Noted  . Rectal bleeding 07/08/2014  . AP (abdominal pain) 07/08/2014  . Hemorrhoid 05/12/2014    Mariah Milling , OTR/L 07/15/2019, 7:02 PM  [facility] [address] [place], [place], [address] Phone: [phone]   Fax:  [phone]  Name: Meghan Walker MRN: [id number] Date of Birth: [birth year]

## 2019-07-22 ENCOUNTER — Ambulatory Visit: Payer: Self-pay | Admitting: Occupational Therapy

## 2019-07-29 ENCOUNTER — Ambulatory Visit: Payer: Self-pay | Admitting: Occupational Therapy

## 2019-07-30 ENCOUNTER — Ambulatory Visit: Payer: Self-pay | Attending: Emergency Medicine | Admitting: Occupational Therapy

## 2019-07-30 ENCOUNTER — Encounter: Payer: Self-pay | Admitting: Occupational Therapy

## 2019-07-30 ENCOUNTER — Other Ambulatory Visit: Payer: Self-pay

## 2019-07-30 DIAGNOSIS — M79642 Pain in left hand: Secondary | ICD-10-CM

## 2019-07-30 DIAGNOSIS — M25642 Stiffness of left hand, not elsewhere classified: Secondary | ICD-10-CM

## 2019-07-30 DIAGNOSIS — M6281 Muscle weakness (generalized): Secondary | ICD-10-CM

## 2019-07-30 DIAGNOSIS — R278 Other lack of coordination: Secondary | ICD-10-CM

## 2019-07-30 NOTE — Therapy (Signed)
[facility] [address] [place], [place], [address] Phone: [phone]   Fax:  [phone]  Occupational Therapy Treatment  Patient Details  Name: Meghan Walker MRN: [id number] Date of Birth: 1971-05-29 Referring Provider (OT): Dr. Amedeo Plenty   Encounter Date: 07/30/2019  OT End of Session - 07/30/19 1656    Visit Number  8    Number of Visits  9    Date for OT Re-Evaluation  07/30/19    Authorization Type  self pay    OT Start Time  1617    OT Stop Time  1655    OT Time Calculation (min)  38 min    Activity Tolerance  Patient tolerated treatment well    Behavior During Therapy  [facility] for tasks assessed/performed       Past Medical History:  Diagnosis Date  . Anxiety   . Depression   . Endometriosis   . Hemorrhoids   . Irregular menstruation   . UTI (lower urinary tract infection)     Past Surgical History:  Procedure Laterality Date  . APPENDECTOMY    . CESAREAN SECTION     X 3  . I&D EXTREMITY Left 04/16/2019   Procedure: IRRIGATION AND DEBRIDEMENT EXTREMITY;  Surgeon: Roseanne Kaufman, MD;  Location: [facility];  Service: Orthopedics;  Laterality: Left;    There were no vitals filed for this visit.  Subjective Assessment - 07/30/19 1622    Subjective   It hurts in the cold.    Currently in Pain?  Yes    Pain Score  7     Pain Location  Hand    Pain Orientation  Left    Pain Descriptors / Indicators  Aching    Pain Type  Acute pain    Pain Onset  1 to 4 weeks ago    Pain Frequency  Intermittent                   OT Treatments/Exercises (OP) - 07/30/19 0001      ADLs   ADL Comments  Reviewed remaining goals.  Patient has met all goals.  Patient is experiencing spike in piain with recent change in temperature.  Patient encouraged to wear hand protection- gloves to avoid hands getting cold and starting pain cycle.  Encouraged her to take breaks at work and run hands under warm H20.   Patient agreeable to OT discharge      Ultrasound   Ultrasound Location  Palm, dorsum of hand    Ultrasound Parameters  15mz, 0.8 w/cm2, x 8 min    Ultrasound Goals  Pain      LUE Fluidotherapy   Number Minutes Fluidotherapy  8 Minutes    LUE Fluidotherapy Location  Hand;Wrist             OT Education - 07/30/19 1655    Education Details  cold management    Person(s) Educated  Patient    Methods  Explanation    Comprehension  Verbalized understanding       OT Short Term Goals - 06/21/19 1619      OT SHORT TERM GOAL #1   Title  I with initial HEP.    Status  Achieved      OT SHORT TERM GOAL #2   Title  Pt will demonstrate at least 95% composite finger flexion for increased functional use.    Status  Achieved      OT SHORT TERM GOAL #3  Title  Pt will verbalize understanding of desensitiztion and scar massage techniques.    Status  On-going      OT SHORT TERM GOAL #4   Title  Pt will resume use of LUE for light functional activities at least 50% of the time.    Status  Achieved        OT Long Term Goals - 07/30/19 1647      OT LONG TERM GOAL #2   Title  Pt will demonstrate thumb MP flexion WFLS for ADLS/IADLs.    Status  Achieved      OT LONG TERM GOAL #3   Title  Pt will resume use of LUE as a non dominant assist at least 80% of the time with pain less than or equal to 3/10.    Status  Achieved      OT LONG TERM GOAL #4   Title  Pt will report that she is able to cook and wash dishes independently using LUE to assist.    Status  Achieved            Plan - 07/30/19 1656    Clinical Impression Statement  Patient has met all goals and is agreable to OT discharge    Rehab Potential  Good    OT Frequency  1x / week    OT Duration  8 weeks    OT Treatment/Interventions  Self-care/ADL training;Therapeutic exercise;Ultrasound;Manual Therapy;Therapeutic activities;Paraffin;Cryotherapy;Neuromuscular education;DME and/or AE  instruction;Fluidtherapy;Patient/family education;Passive range of motion;Moist Heat;Contrast Bath    Plan  discharge    Consulted and Agree with Plan of Care  Patient       Patient will benefit from skilled therapeutic intervention in order to improve the following deficits and impairments:           Visit Diagnosis: Stiffness of left hand, not elsewhere classified  Other lack of coordination  Pain in left hand  Muscle weakness (generalized)    Problem List Patient Active Problem List   Diagnosis Date Noted  . Rectal bleeding 07/08/2014  . AP (abdominal pain) 07/08/2014  . Hemorrhoid 05/12/2014   OCCUPATIONAL THERAPY DISCHARGE SUMMARY  Visits from Start of Care: 8  Current functional level related to goals / functional outcomes: All goals met   Remaining deficits: Pain with cold and lifting/.heavy work   Scientist, physiological / Equipment: HEP/ SCAR / [place]: Patient agrees to discharge.  Patient goals were met. Patient is being discharged due to meeting the stated rehab goals.  ?????     Mariah Milling , OTR/L 07/30/2019, 5:18 PM  [facility] [address] [place], [place], [address] Phone: [phone]   Fax:  [phone]  Name: Meghan Walker MRN: [id number] Date of Birth: 10-31-1970

## 2019-10-23 ENCOUNTER — Ambulatory Visit (INDEPENDENT_AMBULATORY_CARE_PROVIDER_SITE_OTHER): Payer: Self-pay | Admitting: Internal Medicine

## 2019-10-23 ENCOUNTER — Encounter: Payer: Self-pay | Admitting: Internal Medicine

## 2019-10-23 ENCOUNTER — Other Ambulatory Visit: Payer: Self-pay

## 2019-10-23 VITALS — BP 124/80 | HR 70 | Resp 12 | Ht <= 58 in | Wt 133.0 lb

## 2019-10-23 DIAGNOSIS — N390 Urinary tract infection, site not specified: Secondary | ICD-10-CM

## 2019-10-23 DIAGNOSIS — Z780 Asymptomatic menopausal state: Secondary | ICD-10-CM

## 2019-10-23 LAB — POCT URINALYSIS DIPSTICK
Bilirubin, UA: NEGATIVE
Glucose, UA: NEGATIVE
Ketones, UA: NEGATIVE
Nitrite, UA: NEGATIVE
Protein, UA: NEGATIVE
Spec Grav, UA: <=1.005 — AB (ref 1.010–1.025)
Urobilinogen, UA: 0.2 E.U./dL
pH, UA: 6.5 (ref 5.0–8.0)

## 2019-10-23 NOTE — Patient Instructions (Signed)
64 ounces agua cada dia  Urinate after intercourse each time  Don't let your symptoms get bad before calling in.

## 2019-10-23 NOTE — Progress Notes (Signed)
    Subjective:    Patient ID: Meghan Walker, female   DOB: Aug 16, 1971, 49 y.o.   MRN: [id number]   HPI   Here to establish Meghan Walker, interprets. 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  Has had recurrent UTIs over past year.  Has been seen for each UTI.  Was seen at [facility] in November 2020 and grew E coli resistant to Bactrim, which was used initially.  She states she was treated with another antibiotic, does not know what the name was.   States she has had symptoms of UTI 8 times over past year.  Went to get checked out 6 times at medical clinics:  OB[facility]GYN, [facility], [place] [facility].  States treated 6 times.  Received what sounds like Ceftriaxone at the beginning of treatment.   She was not evaluated for what was causing recurrent UTIs. She would purchase antibiotics at a [place] shop when she could not afford to go to the doctor.    Has a previous history of UTI 2-3 times per year--problem for 16 years.   Married 30 years.   Oldest child is 50 years old. She does not always urinate after intercourse.  Does notice she is more likely have a UTI if she does not urinate afterward--just doesn't feel like urinating afterward.   She used to drink Coke, coffee, tea and juice regularly, but now mainly just water. Drinks 3-5 bottles of water daily.  Not clear about volume Admits to holding her urine frequently throughout the day. 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  Stopped having periods about 1 year ago.  + hot flashes and night sweats.  No outpatient medications have been marked as taking for the 10/23/19 encounter (Office Visit) with Julieanne Manson, MD.   No Known Allergies   Review of Systems    Objective:   BP 124/80 (BP Location: Left Arm, Patient Position: Sitting, Cuff Size: Normal)   Pulse 70   Resp 12   Ht 4' 7.25" (1.403 m)   Wt 133 lb (60.3 kg)   LMP 03/16/2019   BMI 30.63 kg/m   Physical Exam   NAD HEENT:  PERRL, EOMI, TMs pearly gray Neck:  Supple, No adenopathy, no thyromegaly Chest:  CTA CV:  RRR with normal S1 and S2, No S3, S4, Murmur or rub.  Radial and DP pulses normal and equal Abd:  S, + BS, No HSM or mass, mild diffuse abdominal tenderness. GU:  Normal external female genitalia.   Assessment & Plan   1.  Recurrent UTi's:  Send for old records.  Encouraged her to call when she has symptoms so we can confirm infection and treat with appropriate antibiotics. Drink 64 ounces of water daily Urinate after intercourse--every time. UA today with trace blood and leuks--will send for culture. Loose pants and cotton underwear  2.  Perimenopause/menopause:  Eat healthy.  Get out and be physically active daily.  3. Stretched thin at times with bills:  To call for food, any need for help with bills/rent and can see if any available support.

## 2019-10-25 LAB — URINE CULTURE

## 2020-01-13 ENCOUNTER — Other Ambulatory Visit (INDEPENDENT_AMBULATORY_CARE_PROVIDER_SITE_OTHER): Payer: Self-pay

## 2020-01-13 DIAGNOSIS — N390 Urinary tract infection, site not specified: Secondary | ICD-10-CM

## 2020-01-13 LAB — POCT URINALYSIS DIPSTICK
Bilirubin, UA: NEGATIVE
Glucose, UA: NEGATIVE
Ketones, UA: NEGATIVE
Nitrite, UA: POSITIVE
Protein, UA: POSITIVE — AB
Spec Grav, UA: 1.015 (ref 1.010–1.025)
Urobilinogen, UA: 0.2 E.U./dL
pH, UA: 7.5 (ref 5.0–8.0)

## 2020-01-13 MED ORDER — CIPROFLOXACIN HCL 500 MG PO TABS: 500.0000 mg | ORAL_TABLET | Freq: Two times a day (BID) | ORAL | 0 refills | Status: DC

## 2020-01-13 MED ORDER — PHENAZOPYRIDINE HCL 200 MG PO TABS: ORAL_TABLET | ORAL | 0 refills | Status: DC

## 2020-01-13 NOTE — Progress Notes (Unsigned)
Patient came in for UA. Patient with burning and pelvic discomfort and slight lower back pain x 4 days. Patient denies any frequency, discharge and odor. Urine sent for culture.

## 2020-01-16 LAB — URINE CULTURE

## 2020-01-17 ENCOUNTER — Telehealth: Payer: Self-pay | Admitting: Internal Medicine

## 2020-01-17 NOTE — Telephone Encounter (Signed)
Patient called stating is done with Cipro and Pyridium but stills having pelvic discomfort and burning with urination. Patient stated symptoms are better but not completely gone.

## 2020-01-17 NOTE — Telephone Encounter (Signed)
To Dr. Mulberry for further directions 

## 2020-01-23 ENCOUNTER — Encounter: Payer: Self-pay | Admitting: Internal Medicine

## 2020-01-23 ENCOUNTER — Ambulatory Visit: Payer: Self-pay | Admitting: Internal Medicine

## 2020-01-23 VITALS — BP 122/64 | HR 72 | Resp 12 | Ht <= 58 in | Wt 131.0 lb

## 2020-01-23 DIAGNOSIS — N390 Urinary tract infection, site not specified: Secondary | ICD-10-CM

## 2020-01-23 DIAGNOSIS — B373 Candidiasis of vulva and vagina: Secondary | ICD-10-CM

## 2020-01-23 DIAGNOSIS — N760 Acute vaginitis: Secondary | ICD-10-CM

## 2020-01-23 DIAGNOSIS — B3731 Acute candidiasis of vulva and vagina: Secondary | ICD-10-CM

## 2020-01-23 LAB — POCT URINALYSIS DIPSTICK
Bilirubin, UA: NEGATIVE
Glucose, UA: NEGATIVE
Ketones, UA: NEGATIVE
Leukocytes, UA: NEGATIVE
Nitrite, UA: NEGATIVE
Protein, UA: NEGATIVE
Spec Grav, UA: >=1.03 — AB (ref 1.010–1.025)
Urobilinogen, UA: 0.2 E.U./dL
pH, UA: 5 (ref 5.0–8.0)

## 2020-01-23 LAB — POCT WET PREP WITH KOH
KOH Prep POC: NEGATIVE
RBC Wet Prep HPF POC: NEGATIVE
Trichomonas, UA: NEGATIVE

## 2020-01-23 MED ORDER — FLUCONAZOLE 150 MG PO TABS: ORAL_TABLET | ORAL | 0 refills | Status: DC

## 2020-01-23 NOTE — Progress Notes (Signed)
    Subjective:    Patient ID: Meghan Walker, female   DOB: 1970-11-22, 49 y.o.   MRN: [id number]   HPI   Recurrent UTI:  Last UTI was 01/13/20, E coli.  Took Cipro 500 mg twice daily for 3 days.  States she did feel better with the Cipro, but the burning with urination did not resolve.   Not clear, but the burning she continues with may be just on the outside along with some white vaginal discharge.  No odor.  She has been urinating after each episode of intercourse.   She also showers after intercourse and after urinating   She has significantly increased her water intake.  She is drinking 64 ounces daily.   She has noted having intercourse when she is in missionary position seems to increase the risk she will develop a UTI She feels since implementing above, she has had fewere symptoms.  She has never had any GU surgery other than a C section.  No outpatient medications have been marked as taking for the 01/23/20 encounter (Office Visit) with Julieanne Manson, MD.   No Known Allergies   Review of Systems    Objective:   BP 122/64 (BP Location: Left Arm, Patient Position: Sitting, Cuff Size: Normal)   Pulse 72   Resp 12   Ht 4' 7.25" (1.403 m)   Wt 131 lb (59.4 kg)   LMP  (LMP Unknown)   BMI 30.17 kg/m   Physical Exam  Lungs:  CTA CV:  RRR without murmur or rub.  Radial pulses normal and equal Abd:  S, NT, No flank tenderness, No HSM or mass, + BS GU:  Mildly inflamed external vulvar area.  Mild white discharge, but very tender with manipulation of vaginal mucosa with speculum.  Mild vaginal mucosal inflammation.  No CMT.  Some tenderness with palpation of uterus--unable to clarify within vaginal canal or of uterus.  No adnexal mass or tenderness.    Assessment & Plan  1.  Recurrent UTI:  Generally occurs after intercourse.  Encouraged patient to be on top with intercourse if decreases her risk of UTI.  Continue other measures she has implemented. Send  urine again for culture to see if cleared or if just dealing with yeast vaginitis now.  2.  Vaginitis:  Yeast on wet prep.  Sending GC/chlamydia as well.  Fluconazole 150 mg daily for 3 days.  3.  COvID--obtaining 1st vaccine tomorrow.

## 2020-01-24 LAB — GC/CHLAMYDIA PROBE AMP
Chlamydia trachomatis, NAA: NEGATIVE
Neisseria Gonorrhoeae by PCR: NEGATIVE

## 2020-01-25 LAB — URINE CULTURE: Organism ID, Bacteria: NO GROWTH

## 2020-01-28 ENCOUNTER — Telehealth: Payer: Self-pay | Admitting: Internal Medicine

## 2020-01-28 NOTE — Telephone Encounter (Signed)
Error

## 2020-01-29 ENCOUNTER — Encounter: Payer: Self-pay | Admitting: Internal Medicine

## 2020-01-29 ENCOUNTER — Ambulatory Visit (INDEPENDENT_AMBULATORY_CARE_PROVIDER_SITE_OTHER): Payer: Self-pay | Admitting: Internal Medicine

## 2020-01-29 VITALS — BP 128/70 | HR 82 | Resp 12 | Ht <= 58 in | Wt 132.0 lb

## 2020-01-29 DIAGNOSIS — B9689 Other specified bacterial agents as the cause of diseases classified elsewhere: Secondary | ICD-10-CM

## 2020-01-29 DIAGNOSIS — N760 Acute vaginitis: Secondary | ICD-10-CM

## 2020-01-29 DIAGNOSIS — N898 Other specified noninflammatory disorders of vagina: Secondary | ICD-10-CM

## 2020-01-29 LAB — POCT WET PREP WITH KOH
Clue Cells Wet Prep HPF POC: POSITIVE
KOH Prep POC: NEGATIVE
RBC Wet Prep HPF POC: NEGATIVE
Trichomonas, UA: NEGATIVE
Yeast Wet Prep HPF POC: NEGATIVE

## 2020-01-29 MED ORDER — METRONIDAZOLE 500 MG PO TABS: ORAL_TABLET | ORAL | 0 refills | Status: DC

## 2020-01-29 NOTE — Progress Notes (Signed)
    Subjective:    Patient ID: Meghan Walker, female   DOB: October 26, 1970, 49 y.o.   MRN: [id number]   HPI   Continues to have a bit of white vaginal discharge, but only when wipes now.  No odor.  No itching.  When tried to have intercourse, had pain and burning--right from the beginning of intercourse.   She does feel she has adequate vaginal lubrication with intercourse.  She washes her genital area with Dial soap and finds it burns.   She does not utilize panty liners.  No further burning with urination.  No outpatient medications have been marked as taking for the 01/29/20 encounter (Office Visit) with Julieanne Manson, MD.   No Known Allergies   Review of Systems    Objective:   BP 128/70 (BP Location: Left Arm, Patient Position: Sitting, Cuff Size: Normal)   Pulse 82   Resp 12   Ht 4' 7.25" (1.403 m)   Wt 132 lb (59.9 kg)   LMP  (LMP Unknown)   BMI 30.40 kg/m   Physical Exam  NAD Abd:  S, NT, No HSM or mass, + BS GU:  Normal external female genitalia.  No significant vaginal or cervical mucosa inflammation or lesion.  Thin white discharge.  No CMT or uterine or adnexal mass or tenderness.  Uncomfortable with insertion of speculum and with manipulation of vaginal canal for bimanual exam, however.  Wet Prep:  + clue cells and whiff--rest normal    Assessment & Plan   Bacterial vaginosis:  Metronidazole 500 mg twice daily for 7 days.  Call if no improvement.

## 2020-04-22 ENCOUNTER — Ambulatory Visit (INDEPENDENT_AMBULATORY_CARE_PROVIDER_SITE_OTHER): Payer: Self-pay | Admitting: Internal Medicine

## 2020-04-22 ENCOUNTER — Other Ambulatory Visit: Payer: Self-pay

## 2020-04-22 DIAGNOSIS — R059 Cough, unspecified: Secondary | ICD-10-CM

## 2020-04-22 DIAGNOSIS — R05 Cough: Secondary | ICD-10-CM

## 2020-04-22 LAB — POC COVID19 BINAXNOW: SARS Coronavirus 2 Ag: NEGATIVE

## 2020-04-22 NOTE — Progress Notes (Signed)
Patient came in for office for visit today but presented with cough, body aches, headache and sore throat. Symptoms started on 04/19/20. Patient was to wait outside and Rapid Covid test done. Test was negative. patient was given all covid precautions. Patient verbalized understanding. Informed we will reschedule regular visit.

## 2020-04-23 ENCOUNTER — Other Ambulatory Visit: Payer: Self-pay | Admitting: Critical Care Medicine

## 2020-04-23 ENCOUNTER — Other Ambulatory Visit: Payer: Self-pay

## 2020-04-23 ENCOUNTER — Ambulatory Visit: Payer: Self-pay | Admitting: Internal Medicine

## 2020-04-23 DIAGNOSIS — Z20822 Contact with and (suspected) exposure to covid-19: Secondary | ICD-10-CM

## 2020-04-24 ENCOUNTER — Other Ambulatory Visit: Payer: Self-pay

## 2020-04-26 LAB — NOVEL CORONAVIRUS, NAA: SARS-CoV-2, NAA: NOT DETECTED

## 2020-04-30 ENCOUNTER — Other Ambulatory Visit: Payer: Self-pay

## 2020-06-09 ENCOUNTER — Encounter: Payer: Self-pay | Admitting: Internal Medicine

## 2020-06-09 ENCOUNTER — Ambulatory Visit: Payer: Self-pay | Admitting: Internal Medicine

## 2020-06-09 VITALS — BP 133/88 | HR 75 | Resp 14 | Ht <= 58 in | Wt 146.0 lb

## 2020-06-09 DIAGNOSIS — R300 Dysuria: Secondary | ICD-10-CM

## 2020-06-09 DIAGNOSIS — N898 Other specified noninflammatory disorders of vagina: Secondary | ICD-10-CM

## 2020-06-09 LAB — POCT URINALYSIS DIPSTICK
Bilirubin, UA: NEGATIVE
Glucose, UA: NEGATIVE
Ketones, UA: NEGATIVE
Nitrite, UA: NEGATIVE
Protein, UA: NEGATIVE
Spec Grav, UA: 1.01 (ref 1.010–1.025)
Urobilinogen, UA: 0.2 E.U./dL
pH, UA: 8.5 — AB (ref 5.0–8.0)

## 2020-06-09 LAB — POCT WET PREP WITH KOH
KOH Prep POC: NEGATIVE
RBC Wet Prep HPF POC: NEGATIVE
Trichomonas, UA: NEGATIVE
Yeast Wet Prep HPF POC: NEGATIVE

## 2020-06-09 MED ORDER — SULFAMETHOXAZOLE-TRIMETHOPRIM 800-160 MG PO TABS: ORAL_TABLET | ORAL | 0 refills | Status: DC

## 2020-06-09 MED ORDER — PHENAZOPYRIDINE HCL 200 MG PO TABS: ORAL_TABLET | ORAL | 0 refills | Status: DC

## 2020-06-09 NOTE — Progress Notes (Signed)
    Subjective:    Patient ID: Meghan Walker, female   DOB: 12-Jul-1971, 49 y.o.   MRN: [id number]   HPI   1.  Dysuria:  Started 5 days ago.  + urinary frequency.  No gross hematuria. No flank pain.  No fever.  + odor with urine.  She is having vaginal discharge as well.  Started yesterday.  White and minimal amount.  Today, noted mild itching.  Has been taking Azo--started 5 days ago.  Last dose was 3 days ago in the morning.  No other meds.    No outpatient medications have been marked as taking for the 06/09/20 encounter (Office Visit) with Julieanne Manson, MD.   No Known Allergies   Review of Systems    Objective:   BP 133/88 (BP Location: Left Arm, Patient Position: Sitting, Cuff Size: Normal)   Pulse 75   Resp 14   Ht 4' 7.5" (1.41 m)   Wt 146 lb (66.2 kg)   LMP 11/21/2018 (Exact Date)   BMI 33.32 kg/m   Physical Exam  NAD Lungs:  CTA CV:  RRR without murmur or rub.  Radial and DP pulses normal and equal Abd:  S, + BS, tender over suprapubic area.  Possibly mild right flank tenderness, No HSM or mass GU: mild white discharge.  No vaginal or cervical lesions or erythema.  No CMT  UA with large Leuks, but nitrite negative,  Trace RBC. Wet prep:  normal  Assessment & Plan   Dysuria:  Likely cystitis:  Bactrim DS 1 tab by mouth twice daily for 3 days as well as pyridium 200 mg every 8 hours as needed for 6 doses. Push fluids. Urine culture.

## 2020-06-15 ENCOUNTER — Ambulatory Visit: Payer: Self-pay | Admitting: Internal Medicine

## 2020-08-19 ENCOUNTER — Encounter: Payer: Self-pay | Admitting: Internal Medicine

## 2020-08-19 ENCOUNTER — Ambulatory Visit: Payer: Self-pay | Admitting: Internal Medicine

## 2020-08-19 ENCOUNTER — Other Ambulatory Visit: Payer: Self-pay

## 2020-08-19 VITALS — BP 128/86 | HR 72 | Resp 20 | Ht <= 58 in | Wt 144.5 lb

## 2020-08-19 DIAGNOSIS — R1032 Left lower quadrant pain: Secondary | ICD-10-CM

## 2020-08-19 DIAGNOSIS — Z87440 Personal history of urinary (tract) infections: Secondary | ICD-10-CM

## 2020-08-19 LAB — POCT WET PREP WITH KOH
Clue Cells Wet Prep HPF POC: NEGATIVE
KOH Prep POC: NEGATIVE
RBC Wet Prep HPF POC: NEGATIVE
Trichomonas, UA: NEGATIVE
Yeast Wet Prep HPF POC: NEGATIVE

## 2020-08-19 LAB — POCT URINALYSIS DIPSTICK
Bilirubin, UA: NEGATIVE
Glucose, UA: NEGATIVE
Ketones, UA: NEGATIVE
Leukocytes, UA: NEGATIVE
Nitrite, UA: NEGATIVE
Protein, UA: NEGATIVE
Spec Grav, UA: 1.01 (ref 1.010–1.025)
Urobilinogen, UA: 0.2 E.U./dL
pH, UA: 5 (ref 5.0–8.0)

## 2020-08-19 NOTE — Progress Notes (Signed)
° ° °  Subjective:    Patient ID: Meghan Walker, female   DOB: 11-10-1970, 49 y.o.   MRN: [id number]   HPI   Interpreted by [name]  1.  Recurrent UTI:  Unfortunately, urine culture did not get performed with last symptomatic episode in October.  She was treated with Bactrim and symptoms resolved. We have documented 1 UTI by culture back in May with 3 episodes of symptoms.  Grew E coli Prior, could only see one culture from 2016 with Klebsiella infection. She continues to urinate after intercourse and drinking lots of water  2.  Pain in left pelvic area for approximately 1 month:  Not what she has generally with UTI symptoms.   Describes the discomfort as needle like or pinching and intermittent.   Occurs once to twice daily and lasts maybe 1-2 hours.  Has not paid much attention to the discomfort.  Forgets about it as she gets busy with her work at [employer].   Pain can awaken her from sleep.     Moderate amount of pain--states she is able to perform her daily routine. Patient with history of endometriosis and endometrioma of right ovary for which she underwent laparoscopic right salpingo- oophorectomy and lysis of adhesions with cauterization of endometrial rests along bladder peritoneum 06/2015 at [facility].   Has not had periods for 2 years.   Has had hot flashes and possibly night sweats. No urinary complaints. No vaginal discharge No pain in same area with intercourse.     No outpatient medications have been marked as taking for the 08/19/20 encounter (Office Visit) with Julieanne Manson, MD.   No Known Allergies   Review of Systems    Objective:   BP 128/86 (BP Location: Right Arm, Patient Position: Sitting, Cuff Size: Normal)    Pulse 72    Resp 20    Ht 4' 8.5" (1.435 m)    Wt 144 lb 8 oz (65.5 kg)    LMP 08/19/2018 Comment: No periods for 2 years 12/21   BMI 31.83 kg/m   Physical Exam  NAD HEENT:  PERRL, EOMI,  Neck:  Supple, No  adenopathy Chest:  CTA CV:  RRR without murmur or rub.  Radial pulses normal and equal Abd:  Diffusely mildly tender--not clear if same tenderness.  Most tenderness in LLQ.  No rebound or peritoneal signs.  No HSM or mass, + BS GU:  Normal external female genitalia.  Minimal white vaginal secretions in cervical area.  No inflammation or lesion of cervix or vaginal mucosa.   No uterine or right adnexal mass or tenderness.  Tender with palpation of left adnexa, though no palpable mass or fullness. No CMT.  UA and Wet prep unremarkable.   Assessment & Plan   LLQ pain:  Sending for pelvic ultrasound.  Discussed without orange card, will cost somewhere around $400 to $500 from past experience at [facility].    Recurrent UTI:  Monitoring.  Nothing since October.  Sounds like this has been a lifelong problem that improved after laparoscopic surgery for endometriosis and removal of right ovary/endometroma back in 2016.  Not all of her episodes or urinary complaints supported by + urine culture.

## 2020-08-22 LAB — GC/CHLAMYDIA PROBE AMP
Chlamydia trachomatis, NAA: NEGATIVE
Neisseria Gonorrhoeae by PCR: NEGATIVE

## 2020-09-08 ENCOUNTER — Ambulatory Visit
Admission: RE | Admit: 2020-09-08 | Discharge: 2020-09-08 | Disposition: A | Discharge status: Home / Self Care | Payer: Self-pay | Source: Ambulatory Visit | Attending: Internal Medicine | Admitting: Internal Medicine

## 2020-09-08 DIAGNOSIS — R1032 Left lower quadrant pain: Secondary | ICD-10-CM

## 2020-09-24 ENCOUNTER — Telehealth: Payer: Self-pay | Admitting: Internal Medicine

## 2020-09-24 NOTE — Telephone Encounter (Signed)
Pt. Called to check in regarding [place] results from 09/08/20. Pt. Informed Dr. Delrae Alfred will review results and someone will call her to share results.

## 2020-09-25 ENCOUNTER — Encounter: Payer: Self-pay | Admitting: Internal Medicine

## 2020-11-23 ENCOUNTER — Telehealth: Payer: Self-pay | Admitting: Internal Medicine

## 2020-11-23 NOTE — Telephone Encounter (Signed)
Patient called requesting the results of the Ultrasound she had on 09/08/2020. Patient stated that she had called previously asking for the result.

## 2020-11-26 NOTE — Telephone Encounter (Signed)
Spoke with patient and notified her of [place] results. Patient stated that she is not longer having discomfort, and she will call back if the symptoms come back.

## 2020-11-26 NOTE — Telephone Encounter (Signed)
Sent her a message back in February via Mychart--would ask her to get back on so she can see the messages.  The ultrasound did not show anything to explain her discomfort and need to know if she is still having issues.

## 2020-12-15 ENCOUNTER — Other Ambulatory Visit: Payer: Self-pay

## 2020-12-15 ENCOUNTER — Other Ambulatory Visit: Payer: Self-pay | Admitting: Internal Medicine

## 2020-12-15 ENCOUNTER — Telehealth: Payer: Self-pay | Admitting: Internal Medicine

## 2020-12-15 DIAGNOSIS — J029 Acute pharyngitis, unspecified: Secondary | ICD-10-CM

## 2020-12-15 DIAGNOSIS — U071 COVID-19: Secondary | ICD-10-CM

## 2020-12-15 DIAGNOSIS — B349 Viral infection, unspecified: Secondary | ICD-10-CM

## 2020-12-15 LAB — POC COVID19 BINAXNOW: SARS Coronavirus 2 Ag: POSITIVE — AB

## 2020-12-15 MED ORDER — NIRMATRELVIR/RITONAVIR (PAXLOVID)TABLET: ORAL_TABLET | ORAL | 0 refills | Status: AC

## 2020-12-15 NOTE — Telephone Encounter (Signed)
Discussed with Dr. Delrae Alfred - patient scheduled for COVID test today at 12pm. Pat aware and confirmed appointment

## 2020-12-15 NOTE — Progress Notes (Signed)
    Subjective:    Patient ID: Meghan Walker, female   DOB: 1970-09-29, 50 y.o.   MRN: [id number]   HPI   Started Sunday, April 24th, 2 days ago, with symptoms of body aches, congestion, cough, headache and sore throat.  Continues with all.  No definite fevers, but feels chilled at times.  Feels some chest discomfort with cough.   She received her Pfizer booster on 08/30/2020.  Her first two in vaccine series obtained in June of 2021.   Husband and 44 yo daughter, who is at school are both vaccinated and boosted as well.  They have no symptoms.    No outpatient medications have been marked as taking for the 12/15/20 encounter (Appointment) with Julieanne Manson, MD.   No Known Allergies   Review of Systems    Objective:   LMP 03/16/2019   Physical Exam  Mildly ill appearing with hoarse voice.  No accessory muscle use for respirations.     Assessment & Plan   COVID Paxlovid 2 tabs and 1 tab by mouth twice daily for 5 days, to start immediately Discussed self isolation until Saturday as long as no fever without fever reducing meds in last 24 hours. To wear KN95 or N95 mask when returns to work To isolate from husband and daughter and they need to wear masks when out of home.  All are fully vaccinated and booster. Anyone in contact 2 days before symptoms until today needs notification.

## 2020-12-15 NOTE — Telephone Encounter (Signed)
Patient called in stating she started having the following symptoms since Sunday and but today symptoms are worse.   *cough *sore throat *headache *fever- she is taking 600 mg of ibuprofen and it helps *shivering *gets really cold, goes away and she starts sweating a lot  No other symptoms. Pt. Is not aware of being in touch with someone positive or suspected of COVID-  * patient has   COVID vaccines  *FYI- patient has a follow up appointment tomorrow 12/16/2020 at [facility]  Patient informed we will discuss with Dr. Delrae Alfred and call her back for possible drive-thru COVID test  Please advise

## 2020-12-16 ENCOUNTER — Ambulatory Visit: Payer: Self-pay | Admitting: Internal Medicine

## 2020-12-22 ENCOUNTER — Telehealth: Payer: Self-pay | Admitting: Internal Medicine

## 2020-12-22 NOTE — Telephone Encounter (Signed)
Patient called asking for advise. Patient stated that her right eye is swollen and has redness around the eye. Patient remember having a similar reaction in the past, but does not remember exactly how she got it.   Dr. Delrae Alfred recommended the patient to take benadryl and cold water compress. However if it's not getting better or if Pt. Thinks is an infection, the recommendation is to go to the Urgent Care.  Patient was informed of Dr. Delrae Alfred recommendations and verbally expressed understating.

## 2020-12-30 ENCOUNTER — Ambulatory Visit: Payer: Self-pay | Admitting: Internal Medicine

## 2021-02-03 ENCOUNTER — Other Ambulatory Visit: Payer: Self-pay

## 2021-02-03 ENCOUNTER — Encounter: Payer: Self-pay | Admitting: Internal Medicine

## 2021-02-03 ENCOUNTER — Ambulatory Visit: Payer: Self-pay | Admitting: Internal Medicine

## 2021-02-03 VITALS — BP 114/70 | HR 68 | Resp 16 | Ht <= 58 in | Wt 130.0 lb

## 2021-02-03 DIAGNOSIS — Z87440 Personal history of urinary (tract) infections: Secondary | ICD-10-CM

## 2021-02-03 DIAGNOSIS — L918 Other hypertrophic disorders of the skin: Secondary | ICD-10-CM

## 2021-02-03 DIAGNOSIS — L659 Nonscarring hair loss, unspecified: Secondary | ICD-10-CM

## 2021-02-03 NOTE — Progress Notes (Signed)
    Subjective:    Patient ID: Meghan Walker, female   DOB: 08/25/1970, 50 y.o.   MRN: [id number]   HPI  Meghan Walker interprets   Possibly recurrent UTIs, though most times not confirmed with a culture.  No symptoms since last seen in December.  Her last symptomatic episode was in October of 2021.   She cannot say she is doing anything different that previous.  Continues to drink lots of water and urinate after sex.    2.  COVID:  Has noted since becoming ill and treated with Paxlovid for COVID 12/15/20 that her hair is falling out.   Seems to be in the area behind her bangs.  She has always had thin hair. She does feel the Paxlovid made a difference in her ability to recover more quickly.  She also seemed to have a prolonged cough, but that finally resolved about 1 month ago.   No constipation Skin not dry with significant change. Energy is definitely improved.    3.  Lesion on her left temple--dries up and falls off.      No outpatient medications have been marked as taking for the 02/03/21 encounter (Office Visit) with Julieanne Manson, MD.   No Known Allergies   Review of Systems    Objective:   BP 114/70 (BP Location: Left Arm, Patient Position: Sitting, Cuff Size: Normal)   Pulse 68   Resp 16   Ht 4' 7.5" (1.41 m)   Wt 130 lb (59 kg)   LMP 03/16/2019   BMI 29.67 kg/m   Physical Exam NAD HEENT:  Gelled hair combed back into pony tail and generally sparse, but more prominently sparse coronally behind bang line.  2 to 3 mm small skin tag at left temple noted.   Neck:  Supple, No adenopathy, no thyromegaly Chest:  CTA CV:  RRR without murmur or rub.  Radial and DP pulses normal and equal Abd:  S, mild LLQ tenderness, No HSM or mass, + BS    Assessment & Plan   Hair loss following COVID illness:  No other symptoms to suggest other cause.  Discussed also hair loss could be due to menopause.    2.  Left temple benign skin lesion  frozen.  3.  Possibly recurrent UTI:  symptom free since October 2021.  CPM.  4.  Pelvic U/S.  Patient unable to get into Mychart and did not see my message that was normal and to call if problems back in February.  She is asymptomatic now.

## 2021-02-24 ENCOUNTER — Telehealth: Payer: Self-pay | Admitting: Internal Medicine

## 2021-02-24 NOTE — Telephone Encounter (Signed)
Patient called to let you know that the skin tag you froze did not came off. Patient stated that you instructed her to call back to report on that.

## 2021-06-16 ENCOUNTER — Telehealth: Payer: Self-pay | Admitting: Internal Medicine

## 2021-08-11 ENCOUNTER — Encounter: Payer: Self-pay | Admitting: Internal Medicine

## 2021-09-16 ENCOUNTER — Ambulatory Visit: Payer: Self-pay | Admitting: Internal Medicine

## 2021-10-25 ENCOUNTER — Encounter: Payer: Self-pay | Admitting: Internal Medicine

## 2021-10-25 ENCOUNTER — Other Ambulatory Visit: Payer: Self-pay

## 2021-10-25 ENCOUNTER — Other Ambulatory Visit: Payer: Self-pay | Admitting: Internal Medicine

## 2021-10-25 ENCOUNTER — Ambulatory Visit: Payer: Self-pay | Admitting: Internal Medicine

## 2021-10-25 VITALS — BP 110/74 | HR 76 | Resp 12 | Ht <= 58 in | Wt 136.0 lb

## 2021-10-25 DIAGNOSIS — R300 Dysuria: Secondary | ICD-10-CM

## 2021-10-25 DIAGNOSIS — Z0000 Encounter for general adult medical examination without abnormal findings: Secondary | ICD-10-CM

## 2021-10-25 DIAGNOSIS — N898 Other specified noninflammatory disorders of vagina: Secondary | ICD-10-CM

## 2021-10-25 DIAGNOSIS — Z1231 Encounter for screening mammogram for malignant neoplasm of breast: Secondary | ICD-10-CM

## 2021-10-25 DIAGNOSIS — Z124 Encounter for screening for malignant neoplasm of cervix: Secondary | ICD-10-CM

## 2021-10-25 LAB — POCT URINALYSIS DIPSTICK
Bilirubin, UA: NEGATIVE
Glucose, UA: NEGATIVE
Nitrite, UA: NEGATIVE
Odor: NEGATIVE
Protein, UA: NEGATIVE
Spec Grav, UA: 1.01 (ref 1.010–1.025)
Urobilinogen, UA: 0.2 E.U./dL
pH, UA: 5 (ref 5.0–8.0)

## 2021-10-25 LAB — POCT WET PREP WITH KOH
KOH Prep POC: NEGATIVE
RBC Wet Prep HPF POC: NEGATIVE
Trichomonas, UA: NEGATIVE
Yeast Wet Prep HPF POC: NEGATIVE

## 2021-10-25 MED ORDER — CIPROFLOXACIN HCL 500 MG PO TABS
500.0000 mg | ORAL_TABLET | Freq: Two times a day (BID) | ORAL | 0 refills | Status: AC
Start: 2021-10-25 — End: 2021-11-04

## 2021-10-25 NOTE — Progress Notes (Signed)
 Subjective:    Patient ID: Meghan Walker, female   DOB: 12-27-1970, 51 y.o.   MRN: [id number]   HPI  Meghan Walker interprets  CPE with pap  1.  Pap:  Last 10/2016 and normal.    2.  Mammogram:  States had a mammogram one year ago--sounds like at [place].  Not clear who  the ordering provider was or why she was sent to [facility].  Perhaps she had the mammogram 2 years ago.  Has had 2 mammograms in past that were both normal.  No family history of breast cancer.  3.  Osteoprevention:  She has one serving of dairy with a smoothie daily.  Not very physically active outside of work.  Willing to work on almond milk intake to 3-4 servings daily and with daily physical activity.    4.  Guaiac Cards/FIT:  never.    5.  Colonoscopy:  Performed 08/29/2014 for rectal bleeding and mild abdominal pain with Dr. Teressa and normal.  Due in 2026 again.  No family history of colon cancer.   6.  Immunizations:  No influenza vaccine this year.  Has not received COVID bivalent booster.    7.  Glucose/Cholesterol:  history of prediabetes.  A1C not checked since 04/2014 and registered 5.9%.  High triglycerides when last checked in 2015.  Lipid Panel     Component Value Date/Time   CHOL 156 05/12/2014 1538   TRIG 318 (H) 05/12/2014 1538   HDL 48 05/12/2014 1538   CHOLHDL 3.3 05/12/2014 1538   VLDL 64 (H) 05/12/2014 1538   LDLCALC 44 05/12/2014 1538      No outpatient medications have been marked as taking for the 10/25/21 encounter (Office Visit) with Meghan Norris, MD.   No Known Allergies  Past Medical History:  Diagnosis Date   Anxiety    Depression    Endometriosis    Hemorrhoids    Irregular menstruation    UTI (lower urinary tract infection)    Past Surgical History:  Procedure Laterality Date   APPENDECTOMY     CESAREAN SECTION     1990, 1994, 2005   HAND SURGERY Left 2020   Required artery, tendon, maybe nerve repair   I & D EXTREMITY Left 04/16/2019   Procedure:  IRRIGATION AND DEBRIDEMENT EXTREMITY;  Surgeon: Meghan Fallow, MD;  Location: [facility] OR;  Service: Orthopedics;  Laterality: Left;   LAPAROSCOPIC SALPINGOOPHERECTOMY Right 06/2015   Findings of endometrioma of right ovary and lysis of adhesions.  Endometrial rests also cauterized on bladder peritoneum.   Family History  Problem Relation Age of Onset   Heart murmur Mother    Ulcers Mother    Hypertension Father    Kidney disease Father        Not from DM--patient states from gout   Gout Father    Diabetes Father    Kidney Stones Sister    Other Brother        Fatty liver   Asthma Daughter    Urinary tract infection Daughter        problem since 1 months old.  ureteral reflux.   Stroke Maternal Grandmother    Social History   Socioeconomic History   Marital status: Married    Spouse name: Meghan Walker   Number of children: 3   Years of education: Not on file   Highest education level: 8th grade  Occupational History   Occupation: food preparation    Comment: [employer]  Tobacco Use  Smoking status: Never    Passive exposure: Never   Smokeless tobacco: Never  Vaping Use   Vaping Use: Never used  Substance and Sexual Activity   Alcohol use: No   Drug use: No   Sexual activity: Yes    Birth control/protection: None, Post-menopausal  Other Topics Concern   Not on file  Social History Narrative   Lives with her husband and their daughter.   Social Determinants of Health   Financial Resource Strain: Low Risk  (10/25/2021)   Overall Financial Resource Strain (CARDIA)    Difficulty of Paying Living Expenses: Not hard at all  Food Insecurity: No Food Insecurity (01/18/2022)   Hunger Vital Sign    Worried About Running Out of Food in the Last Year: Never true    Ran Out of Food in the Last Year: Never true  Transportation Needs: No Transportation Needs (01/18/2022)   PRAPARE - Administrator, Civil Service (Medical): No    Lack of Transportation (Non-Medical): No   Physical Activity: Not on file  Stress: Not on file  Social Connections: Not on file  Intimate Partner Violence: Not At Risk (10/25/2021)   Humiliation, Afraid, Rape, and Kick questionnaire    Fear of Current or Ex-Partner: No    Emotionally Abused: No    Physically Abused: No    Sexually Abused: No      Review of Systems  Genitourinary:  Positive for dysuria (pressure and pain in lower abdomen--burning on outside however) and vaginal discharge (White without odor.).       Seems to be having a lot of yeast infections and wonders why.     Objective:   BP 110/74 (BP Location: Left Arm, Patient Position: Sitting, Cuff Size: Normal)   Pulse 76   Resp 12   Ht 4' 7.5 (1.41 m)   Wt 136 lb (61.7 kg)   LMP 03/16/2019   BMI 31.04 kg/m   Physical Exam Constitutional:      Appearance: She is obese.  HENT:     Head: Normocephalic and atraumatic.     Right Ear: Tympanic membrane, ear canal and external ear normal.     Left Ear: Tympanic membrane, ear canal and external ear normal.     Nose: Nose normal.     Mouth/Throat:     Mouth: Mucous membranes are moist.     Pharynx: Oropharynx is clear.  Eyes:     Extraocular Movements: Extraocular movements intact.     Conjunctiva/sclera: Conjunctivae normal.     Pupils: Pupils are equal, round, and reactive to light.     Comments: Discs sharp  Neck:     Thyroid: No thyroid mass or thyromegaly.  Cardiovascular:     Rate and Rhythm: Normal rate and regular rhythm.     Heart sounds: S1 normal and S2 normal. No murmur heard.    No friction rub. No S3 or S4 sounds.     Comments: No carotid bruits.  Carotid, radial, femoral, DP and PT pulses normal and equal.   Pulmonary:     Effort: Pulmonary effort is normal.     Breath sounds: Normal breath sounds and air entry.  Chest:  Breasts:    Right: No inverted nipple, mass or nipple discharge.     Left: No inverted nipple, mass or nipple discharge.  Abdominal:     General: Bowel sounds  are normal.     Palpations: Abdomen is soft. There is no hepatomegaly, splenomegaly or mass.  Tenderness: There is abdominal tenderness (suprapubic only.  No definitie flank tenderness).     Hernia: No hernia is present.  Genitourinary:    Comments: Normal external female genitalia Scant light yellow vaginal discharge No cervical or vaginal mucosal lesions or inflammation. No uterine or adnexal mass or tenderness Musculoskeletal:        General: Normal range of motion.     Cervical back: Normal range of motion and neck supple.     Right lower leg: No edema.     Left lower leg: No edema.  Lymphadenopathy:     Head:     Right side of head: No submental or submandibular adenopathy.     Left side of head: No submental or submandibular adenopathy.     Cervical: No cervical adenopathy.     Upper Body:     Right upper body: No supraclavicular or axillary adenopathy.     Left upper body: No supraclavicular or axillary adenopathy.     Lower Body: No right inguinal adenopathy. No left inguinal adenopathy.  Skin:    General: Skin is warm.     Capillary Refill: Capillary refill takes less than 2 seconds.     Findings: No rash.  Neurological:     General: No focal deficit present.     Mental Status: She is alert and oriented to person, place, and time.     Cranial Nerves: Cranial nerves 2-12 are intact.     Sensory: Sensation is intact.     Motor: Motor function is intact.     Coordination: Coordination is intact.     Deep Tendon Reflexes: Reflexes are normal and symmetric.  Psychiatric:        Mood and Affect: Mood normal.        Speech: Speech normal.        Behavior: Behavior normal. Behavior is cooperative.      Assessment & Plan    CPE with pap FIT to return in 2 weeks Return inf 1 week for fasting labsa:  FLP, A1C, CBC, CmP, HIV and Hep C screen Encouraged bivalent COVID booster--she will think about it. Send for mammogram    Dysuria/Probable UTI:  cipro  500 mg twice  daily for 10 days and send urine for culture.    3  Vaginal DC:  no inflammatory findings on exam and wet prep not supportive of etiology.  GC/chlamydia.

## 2021-10-27 LAB — GC/CHLAMYDIA PROBE AMP
Chlamydia trachomatis, NAA: NEGATIVE
Neisseria Gonorrhoeae by PCR: NEGATIVE

## 2021-10-27 LAB — CYTOLOGY - PAP

## 2021-10-29 LAB — URINE CULTURE

## 2021-10-29 NOTE — Progress Notes (Signed)
Pt. Informed of results and states she is feeling better with antibiotics. ?

## 2021-10-29 NOTE — Progress Notes (Signed)
Pt. Informed of results and states she is feeling better with antibiotics. ?

## 2021-11-01 ENCOUNTER — Other Ambulatory Visit: Payer: Self-pay

## 2021-11-01 DIAGNOSIS — Z0000 Encounter for general adult medical examination without abnormal findings: Secondary | ICD-10-CM

## 2021-11-01 DIAGNOSIS — Z1211 Encounter for screening for malignant neoplasm of colon: Secondary | ICD-10-CM

## 2021-11-02 LAB — COMPREHENSIVE METABOLIC PANEL
ALT: 32 IU/L (ref 0–32)
AST: 26 IU/L (ref 0–40)
Albumin/Globulin Ratio: 1.7 (ref 1.2–2.2)
Albumin: 4.5 g/dL (ref 3.8–4.8)
Alkaline Phosphatase: 79 IU/L (ref 44–121)
BUN/Creatinine Ratio: 13 (ref 9–23)
BUN: 8 mg/dL (ref 6–24)
Bilirubin Total: 0.5 mg/dL (ref 0.0–1.2)
CO2: 22 mmol/L (ref 20–29)
Calcium: 9.3 mg/dL (ref 8.7–10.2)
Chloride: 101 mmol/L (ref 96–106)
Creatinine, Ser: 0.62 mg/dL (ref 0.57–1.00)
Globulin, Total: 2.6 g/dL (ref 1.5–4.5)
Glucose: 89 mg/dL (ref 70–99)
Potassium: 3.7 mmol/L (ref 3.5–5.2)
Sodium: 144 mmol/L (ref 134–144)
Total Protein: 7.1 g/dL (ref 6.0–8.5)
eGFR: 110 mL/min/{1.73_m2} (ref >59)

## 2021-11-02 LAB — HEPATITIS C ANTIBODY: Hep C Virus Ab: NONREACTIVE

## 2021-11-02 LAB — CBC WITH DIFFERENTIAL/PLATELET
Basophils Absolute: 0.1 10*3/uL (ref 0.0–0.2)
Basos: 1 %
EOS (ABSOLUTE): 0.1 10*3/uL (ref 0.0–0.4)
Eos: 1 %
Hematocrit: 41.4 % (ref 34.0–46.6)
Hemoglobin: 13.5 g/dL (ref 11.1–15.9)
Immature Grans (Abs): 0 10*3/uL (ref 0.0–0.1)
Immature Granulocytes: 0 %
Lymphocytes Absolute: 2.4 10*3/uL (ref 0.7–3.1)
Lymphs: 48 %
MCH: 29.2 pg (ref 26.6–33.0)
MCHC: 32.6 g/dL (ref 31.5–35.7)
MCV: 90 fL (ref 79–97)
Monocytes Absolute: 0.3 10*3/uL (ref 0.1–0.9)
Monocytes: 6 %
Neutrophils Absolute: 2.3 10*3/uL (ref 1.4–7.0)
Neutrophils: 44 %
Platelets: 229 10*3/uL (ref 150–450)
RBC: 4.62 x10E6/uL (ref 3.77–5.28)
RDW: 12.6 % (ref 11.7–15.4)
WBC: 5.2 10*3/uL (ref 3.4–10.8)

## 2021-11-02 LAB — LIPID PANEL W/O CHOL/HDL RATIO
Cholesterol, Total: 143 mg/dL (ref 100–199)
HDL: 59 mg/dL (ref >39)
LDL Chol Calc (NIH): 71 mg/dL (ref 0–99)
Triglycerides: 65 mg/dL (ref 0–149)
VLDL Cholesterol Cal: 13 mg/dL (ref 5–40)

## 2021-11-02 LAB — HEMOGLOBIN A1C
Est. average glucose Bld gHb Est-mCnc: 117 mg/dL
Hgb A1c MFr Bld: 5.7 % — ABNORMAL HIGH (ref 4.8–5.6)

## 2021-11-02 LAB — HIV ANTIBODY (ROUTINE TESTING W REFLEX): HIV Screen 4th Generation wRfx: NONREACTIVE

## 2021-11-05 ENCOUNTER — Telehealth: Payer: Self-pay

## 2021-11-05 MED ORDER — FLUCONAZOLE 150 MG PO TABS: 150.0000 mg | ORAL_TABLET | Freq: Once | ORAL | 0 refills | Status: AC

## 2021-11-05 NOTE — Telephone Encounter (Signed)
Patient called asking for rx for yeast infection after completing antibiotics. After speaking with Dr Delrae Alfred, 1 tab of 150mg  Fluconazole will be sent. Patient has been notified of rx. ?

## 2021-11-08 LAB — POC FIT TEST STOOL: Fecal Occult Blood: NEGATIVE

## 2021-11-08 NOTE — Addendum Note (Signed)
Addended by: Duayne Cal on: 11/08/2021 10:28 AM ? ? Modules accepted: Orders ? ?

## 2022-01-18 ENCOUNTER — Ambulatory Visit: Payer: Self-pay | Admitting: *Deleted

## 2022-01-18 VITALS — BP 120/76 | Wt 132.1 lb

## 2022-01-18 DIAGNOSIS — Z1239 Encounter for other screening for malignant neoplasm of breast: Secondary | ICD-10-CM

## 2022-01-18 DIAGNOSIS — N6452 Nipple discharge: Secondary | ICD-10-CM

## 2022-01-18 NOTE — Patient Instructions (Signed)
Explained breast self awareness with Meghan Walker. Patient did not need a Pap smear today due to last Pap smear was 10/25/2021. Let her know [facility]CP will cover Pap smears every 3 years unless has a history of abnormal Pap smears. Referred patient to [facility] for a diagnostic mammogram. Appointment scheduled Tuesday, Jan 18, 2022 at 1045. Patient aware of appointment and will be there. Meghan Walker verbalized understanding.  Meghan Walker, Meghan Maser, RN 9:17 AM

## 2022-01-18 NOTE — Progress Notes (Signed)
Meghan Walker is a 51 y.o. female who presents to [facility] clinic today with complaint of left breast spontaneous milky discharge since 2019. Patient stated the last time she noticed was 2 months ago.    Pap Smear: Pap smear not completed today. Last Pap smear was 10/25/2021 at [facility] clinic and was normal. Per patient has no history of an abnormal Pap smear. Last Pap smear result is available in Epic.   Physical exam: Breasts Breasts symmetrical. No skin abnormalities bilateral breasts. No nipple retraction bilateral breasts. No nipple discharge bilateral breasts. Unable to express any nipple discharge from left breast on exam. No lymphadenopathy. No lumps palpated bilateral breasts. No complaints of pain or tenderness on exam.       Pelvic/Bimanual Pap is not indicated today per B[facility]CP guidelines.   Smoking History: Patient has never smoked.   Patient Navigation: Patient education provided. Access to services provided for patient through [place] program. Spanish interpreter Rudene Anda from [facility] provided.   Colorectal Cancer Screening: Per patient has had colonoscopy completed on 08/29/2014 that was benign.  FIT Test completed 11/08/2021 that was negative. No complaints today.    Breast and Cervical Cancer Risk Assessment: Patient does not have family history of breast cancer, known genetic mutations, or radiation treatment to the chest before age 46. Patient does not have history of cervical dysplasia, immunocompromised, or DES exposure in-utero.  Risk Assessment     Risk Scores       01/18/2022   Last edited by: Demetrius Revel, LPN   5-year risk: 0.5 %   Lifetime risk: 4.7 %            A: B[facility]CP exam without pap smear Complaint of left breast discharge.  P: Referred patient to [facility] for a diagnostic mammogram. Appointment scheduled Tuesday, Jan 18, 2022 at 1045.  Loletta Parish, RN 01/18/2022 9:17 AM

## 2022-01-28 ENCOUNTER — Telehealth: Payer: Self-pay

## 2022-01-28 NOTE — Telephone Encounter (Signed)
  Patient called asking for an appointment for leg pain and fatigue. Has had this issue for about 2 months. No other Sx. Pain is everyday and usually constant (day and night). Describes pain feeling like leg are out of place of going to fall off. Has taking ibuprofen (600mg -800mg ) which helps some, but pain always returns.

## 2022-02-23 NOTE — Telephone Encounter (Signed)
Patient has been scheduled

## 2022-02-28 ENCOUNTER — Ambulatory Visit: Payer: Self-pay | Admitting: Internal Medicine

## 2022-02-28 ENCOUNTER — Encounter: Payer: Self-pay | Admitting: Internal Medicine

## 2022-02-28 VITALS — BP 130/74 | HR 76 | Resp 12 | Ht <= 58 in | Wt 137.0 lb

## 2022-02-28 DIAGNOSIS — M7062 Trochanteric bursitis, left hip: Secondary | ICD-10-CM

## 2022-02-28 DIAGNOSIS — M7061 Trochanteric bursitis, right hip: Secondary | ICD-10-CM

## 2022-02-28 MED ORDER — IBUPROFEN 200 MG PO TABS: ORAL_TABLET | ORAL | 0 refills | Status: DC

## 2022-02-28 NOTE — Progress Notes (Signed)
    Subjective:    Patient ID: Meghan Walker, female   DOB: [birth year], 51 y.o.   MRN: [id number]   HPI  Interpreted by Mayer Camel  Bilateral lateral thigh pain for 6 months, the right more so than left.  Ibuprofen helps a bit at times.  She does not recall any overuse with work or an acute injury preceding.   Unable to lie on her right side due to pain. If wears little heels, the pain is worse.   If she runs, the pain is worse.   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  Standing on hard floors.  Nothing has changed with work prior to pain starting.   In afternoon, she is at home, daily household chores.  No outpatient medications have been marked as taking for the 02/28/22 encounter (Office Visit) with Julieanne Manson, MD.   No Known Allergies   Review of Systems    Objective:   BP 130/74 (BP Location: Right Arm, Patient Position: Sitting, Cuff Size: Normal)   Pulse 76   Resp 12   Ht 4\' 8"  (1.422 m)   Wt 137 lb (62.1 kg)   LMP 08/19/2018 Comment: No periods for 2 years 12/21  BMI 30.71 kg/m   Physical Exam Gait normal Exquisite tenderness overlying bilateral greater trochanteric, which reproduces the pain.  Significant pain with ankle crossed over knee, R>>L.  Full ROM of hips, knees.      Assessment & Plan   Bilateral greater trochanteric bursitis:  stretches twice daily.  Ibuprofen 600 mg twice daily for 14 days.  Referral to [facility] clinic.

## 2022-04-14 ENCOUNTER — Ambulatory Visit: Payer: Self-pay | Admitting: Internal Medicine

## 2022-05-16 ENCOUNTER — Telehealth: Payer: Self-pay | Admitting: Internal Medicine

## 2022-05-16 MED ORDER — FLUCONAZOLE 150 MG PO TABS: ORAL_TABLET | ORAL | 0 refills | Status: DC

## 2022-05-16 NOTE — Telephone Encounter (Signed)
Here with husband who is suffering from yeast balanitis. She also is with white vaginal discharge

## 2022-09-19 ENCOUNTER — Other Ambulatory Visit: Payer: Self-pay

## 2022-09-19 ENCOUNTER — Telehealth: Payer: Self-pay

## 2022-09-19 MED ORDER — NIRMATRELVIR/RITONAVIR (PAXLOVID)TABLET: 3.0000 | ORAL_TABLET | Freq: Two times a day (BID) | ORAL | 0 refills | Status: AC

## 2022-09-19 NOTE — Telephone Encounter (Signed)
Patient called to report that she has tested positive for covid using a home test on 09/18/2022. Symptoms started Saturday 09/17/2021 and include headache, body aches, congestion. Patient would like recommendation or rx.

## 2022-09-19 NOTE — Telephone Encounter (Signed)
Sent in paxlovid 3 tabs twice daily for 5 daily to the pharmacy. Patient reported that the cost is over $1000. Would like an alternative.

## 2022-09-23 NOTE — Telephone Encounter (Signed)
Patient would like an appointment for covid. Patient if feeling better, but would like a check up

## 2022-09-29 ENCOUNTER — Ambulatory Visit: Payer: Self-pay | Admitting: Internal Medicine

## 2022-09-29 ENCOUNTER — Encounter: Payer: Self-pay | Admitting: Internal Medicine

## 2022-09-29 VITALS — BP 138/88 | HR 72 | Resp 20 | Ht <= 58 in | Wt 147.0 lb

## 2022-09-29 DIAGNOSIS — U071 COVID-19: Secondary | ICD-10-CM

## 2022-09-29 DIAGNOSIS — N760 Acute vaginitis: Secondary | ICD-10-CM

## 2022-09-29 DIAGNOSIS — N949 Unspecified condition associated with female genital organs and menstrual cycle: Secondary | ICD-10-CM

## 2022-09-29 DIAGNOSIS — B9689 Other specified bacterial agents as the cause of diseases classified elsewhere: Secondary | ICD-10-CM

## 2022-09-29 MED ORDER — METRONIDAZOLE 500 MG PO TABS
500.0000 mg | ORAL_TABLET | Freq: Two times a day (BID) | ORAL | 0 refills | Status: AC
Start: 2022-09-29 — End: 2022-10-06

## 2022-09-29 MED ORDER — ALBUTEROL SULFATE HFA 108 (90 BASE) MCG/ACT IN AERS: 2.0000 | INHALATION_SPRAY | Freq: Four times a day (QID) | RESPIRATORY_TRACT | 1 refills | Status: DC | PRN

## 2022-09-29 NOTE — Progress Notes (Signed)
    Subjective:    Patient ID: Meghan Walker, female   DOB: October 09, 1970, 52 y.o.   MRN: [id number]   HPI  Meghan Walker interprets   COVID 19, which was diagnosed with home testing on Sunday, Jan 28th.  Symptoms apparently started 2 days earlier.  Had a total of COVID vaccines in past, the last given 08/2020.   She continues to be very tired/fatigued.  She is having a lot of leg aching still. She continues to cough--having paroxyms of cough--generally when lies down to sleep.   No fevers. Gets short winded if goes to fast with ADL.  Feels she cannot get a big enough breath at times.   Went back to work on Feb 5th.    No outpatient medications have been marked as taking for the 09/29/22 encounter (Office Visit) with Mack Hook, MD.   No Known Allergies   Review of Systems    Objective:   BP 138/88 (BP Location: Left Arm, Patient Position: Sitting, Cuff Size: Normal)   Pulse 72   Resp 20   Ht 4\' 8"  (1.422 m)   Wt 147 lb (66.7 kg)   LMP 08/19/2018 Comment: No periods for 2 years 12/21  BMI 32.96 kg/m   Physical Exam Occasional cough NAD HEENT:  PERRL, EOMI, TMs pearly gray, throat without injection. Neck:  Supple, No adenopathy Chest:  CTA with good air movement CV:  RRR without murmur or rub.  Radial pulses normal and equal. Abd:  S, NT, No HSM or mass, + BS LE:  No edeam  No trich, lots of wbcs  no yeast.  + whiff.  Some clue cells  Assessment & Plan   Recent COVID:  discussed her symptoms could still just be resolution of her acute illness/not unusual to still not be back to baseline at 1.5 weeks out.  Did prescribe an albuterol inhaler to utilize 2 puffs as needed every 6 hours if her chest feels tight  2.  Bacterial Vaginosis:  She has dealt with this previously. Metronidazole 500 mg twice daily for 7 days.

## 2022-09-30 LAB — POCT WET PREP WITH KOH
KOH Prep POC: NEGATIVE
Trichomonas, UA: NEGATIVE
Yeast Wet Prep HPF POC: NEGATIVE

## 2022-09-30 LAB — POC COVID19 BINAXNOW: SARS Coronavirus 2 Ag: NEGATIVE

## 2022-10-26 ENCOUNTER — Other Ambulatory Visit: Payer: Self-pay

## 2022-10-31 ENCOUNTER — Other Ambulatory Visit: Payer: Self-pay

## 2022-10-31 DIAGNOSIS — R300 Dysuria: Secondary | ICD-10-CM

## 2022-10-31 LAB — POCT URINALYSIS DIPSTICK
Bilirubin, UA: NEGATIVE
Glucose, UA: NEGATIVE
Ketones, UA: NEGATIVE
Leukocytes, UA: NEGATIVE
Nitrite, UA: POSITIVE — AB
Protein, UA: NEGATIVE
Spec Grav, UA: >=1.03 — AB (ref 1.010–1.025)
Urobilinogen, UA: 0.2 E.U./dL
pH, UA: 6 (ref 5.0–8.0)

## 2022-10-31 NOTE — Progress Notes (Unsigned)
Patient came in for complaints of fever, body aches, dysuria, odor in her urine and some yellow/white discharge. Symptoms started 10/26/22 and started Bactrim DS 2 tabs twice daily for 3 days. Medication helped with fever and body ache, but all other symptoms continue.

## 2022-11-01 ENCOUNTER — Other Ambulatory Visit: Payer: Self-pay

## 2022-11-01 DIAGNOSIS — N898 Other specified noninflammatory disorders of vagina: Secondary | ICD-10-CM

## 2022-11-01 DIAGNOSIS — R8290 Unspecified abnormal findings in urine: Secondary | ICD-10-CM

## 2022-11-01 LAB — POCT WET PREP WITH KOH
KOH Prep POC: NEGATIVE
Trichomonas, UA: NEGATIVE
Yeast Wet Prep HPF POC: NEGATIVE

## 2022-11-01 MED ORDER — CIPROFLOXACIN HCL 500 MG PO TABS: 500.0000 mg | ORAL_TABLET | Freq: Two times a day (BID) | ORAL | 0 refills | Status: AC

## 2022-11-01 MED ORDER — METRONIDAZOLE 500 MG PO TABS: ORAL_TABLET | ORAL | 0 refills | Status: DC

## 2022-11-01 NOTE — Progress Notes (Signed)
Patient here for labs She gives history of developing a white vaginal discharge without any pain, itching or odor last week. Later, developed body aches, fever, headaches, burning on urination (not clear if this is just external or internal as well) Was seen at another facility after being put in for labs here to evaluate and not showing.  Told her symptoms likely due to UTI based on UA Placed on Bactrim DS twice daily for 3 days (6 days ago). States the fever, headache and body aches improved over first 2 days of abx Her burning on urination (at least external/possibly internal did not improve.) She came in yesterday and urine hazy with + nitrite and blood.  Sent for culture, which is pending. Continues to have vaginal discharge. She no longer has periods.  NAD  Wet prep with many WBCs, few RBCs, + clue cells and bacteria, + whiff.  Vaginal discharge and burning on urination with abnormal UA:  Not clear if all related to a urine infection.  She is unable to tell us what clinic she went to in [place]--a friend recommended she go there, so unable to obtain records. Her urine culture here is pending, though UA certainly abnormal.  Possibly contaminated with vaginal discharge.   Will start her on Metronidazole for BV and await culture results.  She is to call if symptoms worsen.    Ultimately, found out she went to [facility], phone [phone]  Urine culture:  + E coli

## 2022-11-03 LAB — URINE CULTURE

## 2022-11-04 LAB — GC/CHLAMYDIA PROBE AMP
Chlamydia trachomatis, NAA: NEGATIVE
Neisseria Gonorrhoeae by PCR: NEGATIVE

## 2022-11-08 ENCOUNTER — Ambulatory Visit: Payer: Self-pay | Admitting: Internal Medicine

## 2022-12-12 ENCOUNTER — Encounter: Payer: Self-pay | Admitting: Internal Medicine

## 2022-12-12 ENCOUNTER — Telehealth: Payer: Self-pay

## 2022-12-12 ENCOUNTER — Ambulatory Visit: Payer: Self-pay | Admitting: Internal Medicine

## 2022-12-12 VITALS — BP 106/68 | HR 72 | Resp 16 | Ht <= 58 in | Wt 140.0 lb

## 2022-12-12 DIAGNOSIS — H1132 Conjunctival hemorrhage, left eye: Secondary | ICD-10-CM | POA: Insufficient documentation

## 2022-12-12 NOTE — Telephone Encounter (Signed)
Patient called to ask for an appointment for an eye problem. Reports that it looks like a blood clot in her eye. Patient asked to come immediately for an appointment.

## 2022-12-12 NOTE — Progress Notes (Signed)
    Subjective:    Patient ID: Meghan Walker, female   DOB: 19-Feb-1971, 52 y.o.   MRN: [id number]   HPI  Meghan Walker interprets.   Left eye with "blood clot"  noted yesterday morning.  States her husband noticed the problem, she was not aware of it.   No cough, heavy lifting, straining with use of bathroom.  No history of injury to the eye.   Today, she has a foreign body sensation--where the "clot" is on temporal aspect of eye.Marland Kitchen   No NSAIDS.   No itchy, watery eyes, nose or throat.   No visual changes.  Current Meds  Medication Sig   Ascorbic Acid (VITAMIN C) 100 MG tablet Take 100 mg by mouth daily. Unsure of dose   Calcium 200 MG TABS Take by mouth daily. Unsure of dose   No Known Allergies   Review of Systems    Objective:   BP 106/68 (BP Location: Left Arm, Patient Position: Sitting, Cuff Size: Normal)   Pulse 72   Resp 16   Ht  (1.422 m)   Wt 140 lb (63.5 kg)   LMP 08/19/2018 Comment: No periods for 2 years 12/21  BMI 31.39 kg/m   Physical Exam NAD HEENT:  PERRL, EOMI, left temporal eye with solid redness.  3mm tubular raised area of thickened blood or vessel.  Discs sharp bilaterally and cornea clear.  No foreign body on fluorescence and no superficial conjunctival injury.   TMs pearly gray.  Throat without injection Neck:  Supple, No adenoapthy Chest:  CTA CV:  RRR without murmur or rub.  Radial pulses normal and equal.   Assessment & Plan   Subconjunctival hemorrhage of left eye.  Encouraged her to not bend over or squat and avoid heavy lifting.  Note for work to avoid over next week. She is to call if the bleeding worsens, or if new symptoms, but expect the blood to gradually be reabsorbed.

## 2022-12-12 NOTE — Telephone Encounter (Signed)
Closing note 

## 2022-12-12 NOTE — Telephone Encounter (Signed)
Patient has been scheduled

## 2022-12-14 ENCOUNTER — Telehealth: Payer: Self-pay

## 2022-12-14 NOTE — Telephone Encounter (Signed)
Patient had an appointment for eye redness on 12/12/22. She reports that eye still has redness and would like Rx for eye drops

## 2022-12-15 NOTE — Telephone Encounter (Signed)
Patient informed that Rx for eye drops will not be sent as it will not help with her diagnosis.

## 2022-12-19 ENCOUNTER — Ambulatory Visit: Payer: Self-pay | Admitting: Internal Medicine

## 2023-04-13 ENCOUNTER — Ambulatory Visit: Payer: Self-pay | Admitting: Internal Medicine

## 2023-04-13 ENCOUNTER — Encounter: Payer: Self-pay | Admitting: Internal Medicine

## 2023-04-13 VITALS — BP 134/72 | HR 80 | Resp 14 | Ht <= 58 in | Wt 145.0 lb

## 2023-04-13 DIAGNOSIS — Z87898 Personal history of other specified conditions: Secondary | ICD-10-CM

## 2023-04-13 DIAGNOSIS — Z1231 Encounter for screening mammogram for malignant neoplasm of breast: Secondary | ICD-10-CM

## 2023-04-13 DIAGNOSIS — R7303 Prediabetes: Secondary | ICD-10-CM

## 2023-04-13 DIAGNOSIS — G44229 Chronic tension-type headache, not intractable: Secondary | ICD-10-CM

## 2023-04-13 DIAGNOSIS — Z0000 Encounter for general adult medical examination without abnormal findings: Secondary | ICD-10-CM

## 2023-04-13 MED ORDER — OYSTER SHELL CALCIUM/D3 500-5 MG-MCG PO TABS: 1.0000 | ORAL_TABLET | Freq: Two times a day (BID) | ORAL | Status: DC

## 2023-04-13 NOTE — Progress Notes (Signed)
Subjective:    Patient ID: Meghan Walker, female   DOB: [birth year], 52 y.o.   MRN: [id number]   HPI  [name] interprets  CPE with pap  1.  Pap:  Last 10/25/2021 and normal.    2.  Mammogram:  States did not get notification for follow up mammogram this year prior to 5/30.  Should have had repeat mammogram end of May with [facility].  She had clear left nipple discharge last year and underwent mammogram and ultrasound, the latter of left breast only.  No abnormalities.  Was to have repeat mammogram in 1 year.   Has not had nipple discharge since about 1 year ago.    3.  Osteoprevention:  Today, clear she is not going to drink milk.  Causes GI upset and she doesn't like any form of the milk.  She states she is taking Calcium, but cannot say how much or if with Vitamin D.  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  4.  Guaiac Cards/FIT:  Last 11/08/2021 and negative.    5.  Colonoscopy:  Never.   No family history of colon cancer.      6.  Immunizations:  Has not had COVID vaccination since 08/30/2020 with her 3rd vaccine.   Immunization History  Administered Date(s) Administered   PFIZER(Purple Top)SARS-COV-2 Vaccination 01/24/2020, 02/14/2020, 08/30/2020   Tdap 04/16/2019     7.  Glucose/Cholesterol:  prediabetes with A1C at 5.7% in 2023.  Cholesterol panel was excellent in 2023 as well. Lipid Panel     Component Value Date/Time   CHOL 143 11/01/2021 0952   TRIG 65 11/01/2021 0952   HDL 59 11/01/2021 0952   CHOLHDL 3.3 05/12/2014 1538   VLDL 64 (H) 05/12/2014 1538   LDLCALC 71 11/01/2021 0952   LABVLDL 13 11/01/2021 0952     Current Meds  Medication Sig   Ascorbic Acid (VITAMIN C) 100 MG tablet Take 100 mg by mouth daily. Unsure of dose   Calcium 200 MG TABS Take by mouth daily. Unsure of dose   No Known Allergies  Past Medical History:  Diagnosis Date   Anxiety    Depression    Endometriosis    Hemorrhoids    Irregular  menstruation    UTI (lower urinary tract infection)    Past Surgical History:  Procedure Laterality Date   APPENDECTOMY     CESAREAN SECTION     1990, 1994, 2005   HAND SURGERY Left 2020   Required artery, tendon, maybe nerve repair   I & D EXTREMITY Left 04/16/2019   Procedure: IRRIGATION AND DEBRIDEMENT EXTREMITY;  Surgeon: Dominica Severin, MD;  Location: [facility] OR;  Service: Orthopedics;  Laterality: Left;   LAPAROSCOPIC SALPINGOOPHERECTOMY Right 06/2015   Findings of endometrioma of right ovary and lysis of adhesions.  Endometrial rests also cauterized on bladder peritoneum.   Family History  Problem Relation Age of Onset   Heart murmur Mother    Ulcers Mother    Hypertension Father    Kidney disease Father        Not from DM--patient states from gout   Gout Father    Diabetes Father    Kidney Stones Sister    Other Brother        Fatty liver   Asthma Daughter    Urinary tract infection Daughter        problem since 30 months old.  ureteral reflux.   Stroke Maternal Grandmother  Social History   Socioeconomic History   Marital status: Married    Spouse name: Donald Pore   Number of children: 3   Years of education: Not on file   Highest education level: 8th grade  Occupational History   Occupation: food preparation    Comment: [employer]  Tobacco Use   Smoking status: Never    Passive exposure: Never   Smokeless tobacco: Never  Vaping Use   Vaping status: Never Used  Substance and Sexual Activity   Alcohol use: No   Drug use: No   Sexual activity: Yes    Birth control/protection: None, Post-menopausal  Other Topics Concern   Not on file  Social History Narrative   Lives with her husband and their daughter.   Social Determinants of Health   Financial Resource Strain: Low Risk  (10/25/2021)   Overall Financial Resource Strain (CARDIA)    Difficulty of Paying Living Expenses: Not hard at all  Food Insecurity: No Food Insecurity (01/18/2022)   Hunger Vital  Sign    Worried About Running Out of Food in the Last Year: Never true    Ran Out of Food in the Last Year: Never true  Transportation Needs: No Transportation Needs (01/18/2022)   PRAPARE - Administrator, Civil Service (Medical): No    Lack of Transportation (Non-Medical): No  Physical Activity: Not on file  Stress: Not on file  Social Connections: Unknown (12/30/2021)   Received [facility]   Social Network    Social Network: Not on file  Intimate Partner Violence: Unknown (11/22/2021)   Received from [facility]   HITS    Physically Hurt: Not on file    Insult or Talk Down To: Not on file    Threaten Physical Harm: Not on file    Scream or Curse: Not on file  SDOH unchanged from 2023.    Review of Systems  Neurological:        Right temporal/parietal headache--like pressure on her head with tenderness over the same area with light touch.  Started more significantly about 2 months ago, but has had these headaches previously, just not so frequently or severe--for years. First lasted 4 hours, even with taking 400 mg of Ibuprofen. Now, having about 1-2 times every week to 2 weeks.  Takes Ibuprofen up to 6 tabs and that seems to help with the headache.    No associated numbness, tingling or weakness associated. Does not feel increased stress.         Objective:   BP 134/72 (BP Location: Right Arm, Patient Position: Sitting, Cuff Size: Normal)   Pulse 80   Resp 14   Ht 4\' 8"  (1.422 m)   Wt 145 lb (65.8 kg)   LMP 08/19/2018 Comment: No periods for 2 years 12/21  BMI 32.51 kg/m   Physical Exam Constitutional:      Appearance: She is obese.  HENT:     Head: Normocephalic and atraumatic.     Right Ear: Tympanic membrane, ear canal and external ear normal.     Left Ear: Tympanic membrane, ear canal and external ear normal.     Nose: Nose normal.     Mouth/Throat:     Mouth: Mucous membranes are moist.     Pharynx: Oropharynx is clear.  Eyes:      Extraocular Movements: Extraocular movements intact.     Conjunctiva/sclera: Conjunctivae normal.     Pupils: Pupils are equal, round, and reactive to light.  Comments: Discs sharp  Neck:     Thyroid: No thyroid mass or thyromegaly.  Cardiovascular:     Rate and Rhythm: Normal rate and regular rhythm.     Heart sounds: S1 normal and S2 normal. No murmur heard.    No friction rub. No S3 or S4 sounds.     Comments: No carotid bruits.  Carotid, radial, femoral, DP and PT pulses normal and equal.   Pulmonary:     Effort: Pulmonary effort is normal.     Breath sounds: Normal breath sounds and air entry.  Chest:  Breasts:    Right: No inverted nipple, mass or nipple discharge.     Left: No inverted nipple, mass or nipple discharge.  Abdominal:     General: Bowel sounds are normal.     Palpations: Abdomen is soft. There is no hepatomegaly, splenomegaly or mass.     Tenderness: There is no abdominal tenderness.     Hernia: No hernia is present.  Genitourinary:    Comments: Normal external female genitalia Has difficulty relaxing for exam No uterine or adnexal mass or tenderness. Musculoskeletal:        General: Normal range of motion.     Cervical back: Normal range of motion and neck supple. Tenderness (right trap up to posterior neck and nuchal ridge.) present.     Right lower leg: No edema.     Left lower leg: No edema.     Comments: Poor posture with hunching of shoulders/hyperextended neck..  Lymphadenopathy:     Head:     Right side of head: No submental or submandibular adenopathy.     Left side of head: No submental or submandibular adenopathy.     Cervical: No cervical adenopathy.     Upper Body:     Right upper body: No supraclavicular or axillary adenopathy.     Left upper body: No supraclavicular or axillary adenopathy.     Lower Body: No right inguinal adenopathy. No left inguinal adenopathy.  Skin:    General: Skin is warm.     Capillary Refill: Capillary refill  takes less than 2 seconds.     Findings: No lesion or rash.  Neurological:     General: No focal deficit present.     Mental Status: She is alert and oriented to person, place, and time.     Cranial Nerves: Cranial nerves 2-12 are intact.     Sensory: Sensation is intact.     Motor: Motor function is intact.     Coordination: Coordination is intact.     Gait: Gait is intact.     Deep Tendon Reflexes: Reflexes are normal and symmetric.  Psychiatric:        Speech: Speech normal.        Behavior: Behavior normal. Behavior is cooperative.     Assessment & Plan   CPE without pap Mammogram ordere--nipple discharge resolved. FIT to return in 2 weeks. Schedule for influenza vaccine clinic. Encouraged obtaining COVID vaccine when out in September. Calcium and Vitamin D supplementation. CBC, CMP  2.  Right tension HA:  referral to [facility] PT clinic.  3.  Prediabetes and obesity:  lifestyle changes.  A1C

## 2023-04-14 LAB — CBC WITH DIFFERENTIAL/PLATELET
Basophils Absolute: 0.1 10*3/uL (ref 0.0–0.2)
Basos: 1 %
EOS (ABSOLUTE): 0.1 10*3/uL (ref 0.0–0.4)
Eos: 1 %
Hematocrit: 38 % (ref 34.0–46.6)
Hemoglobin: 12.5 g/dL (ref 11.1–15.9)
Immature Grans (Abs): 0 10*3/uL (ref 0.0–0.1)
Immature Granulocytes: 0 %
Lymphocytes Absolute: 3.4 10*3/uL — ABNORMAL HIGH (ref 0.7–3.1)
Lymphs: 38 %
MCH: 30.3 pg (ref 26.6–33.0)
MCHC: 32.9 g/dL (ref 31.5–35.7)
MCV: 92 fL (ref 79–97)
Monocytes Absolute: 0.6 10*3/uL (ref 0.1–0.9)
Monocytes: 6 %
Neutrophils Absolute: 4.8 10*3/uL (ref 1.4–7.0)
Neutrophils: 54 %
Platelets: 266 10*3/uL (ref 150–450)
RBC: 4.12 x10E6/uL (ref 3.77–5.28)
RDW: 12.3 % (ref 11.7–15.4)
WBC: 8.9 10*3/uL (ref 3.4–10.8)

## 2023-04-14 LAB — COMPREHENSIVE METABOLIC PANEL
ALT: 32 IU/L (ref 0–32)
AST: 20 IU/L (ref 0–40)
Albumin: 4.5 g/dL (ref 3.9–4.9)
Alkaline Phosphatase: 92 IU/L (ref 44–121)
BUN/Creatinine Ratio: 31 — ABNORMAL HIGH (ref 9–23)
BUN: 22 mg/dL (ref 6–24)
Bilirubin Total: <0.2 mg/dL (ref 0.0–1.2)
CO2: 22 mmol/L (ref 20–29)
Calcium: 9.4 mg/dL (ref 8.7–10.2)
Chloride: 106 mmol/L (ref 96–106)
Creatinine, Ser: 0.72 mg/dL (ref 0.57–1.00)
Globulin, Total: 2.6 g/dL (ref 1.5–4.5)
Glucose: 97 mg/dL (ref 70–99)
Potassium: 3.9 mmol/L (ref 3.5–5.2)
Sodium: 142 mmol/L (ref 134–144)
Total Protein: 7.1 g/dL (ref 6.0–8.5)
eGFR: 102 mL/min/{1.73_m2} (ref >59)

## 2023-04-14 LAB — HGB A1C W/O EAG: Hgb A1c MFr Bld: 5.9 % — ABNORMAL HIGH (ref 4.8–5.6)

## 2023-04-28 ENCOUNTER — Other Ambulatory Visit: Payer: Self-pay

## 2023-04-28 DIAGNOSIS — Z1211 Encounter for screening for malignant neoplasm of colon: Secondary | ICD-10-CM

## 2023-04-28 LAB — POC FIT TEST STOOL: Fecal Occult Blood: NEGATIVE

## 2023-05-23 ENCOUNTER — Ambulatory Visit: Payer: Self-pay | Admitting: Internal Medicine

## 2023-05-23 DIAGNOSIS — Z23 Encounter for immunization: Secondary | ICD-10-CM

## 2023-06-08 ENCOUNTER — Ambulatory Visit (INDEPENDENT_AMBULATORY_CARE_PROVIDER_SITE_OTHER): Payer: Self-pay | Admitting: Internal Medicine

## 2023-06-08 DIAGNOSIS — Z23 Encounter for immunization: Secondary | ICD-10-CM

## 2023-06-09 ENCOUNTER — Telehealth: Payer: Self-pay

## 2023-06-09 NOTE — Telephone Encounter (Signed)
Patient called today because she had a covid shot the day before and today she called to inform that she is having a head ache and also she feels her body weak. Patient also stated she is now having pain on her back which she feels like if it was pain in her lungs.

## 2023-06-12 NOTE — Telephone Encounter (Signed)
We discussed this last Friday on the 18th:  she had not taken anything such as Tylenol--preferred taking ibuprofen.  She was instructed to take Ibuprofen and call back if no improvement in ensuing hour

## 2023-06-13 ENCOUNTER — Telehealth: Payer: Self-pay

## 2023-06-13 NOTE — Telephone Encounter (Signed)
Patient needs an appointment , patient has been having lower back pain and is unable to carry heavy things.  We will call patient if there is a cancellation.

## 2023-06-13 NOTE — Telephone Encounter (Signed)
Patient called and would like to know if she can get a work note to excuse her from work since she has not being able to work since Friday.  Patient would like to know if she needs appointment for the note or if she can get it before.

## 2023-06-13 NOTE — Telephone Encounter (Signed)
Per. Dr. Delrae Alfred- Patient to be added to the wait list to have her come in in the next couple of days. Patient informed and will be available for any opening

## 2023-06-13 NOTE — Telephone Encounter (Signed)
Patient called again today stating she continues to have pain on her lower back and that she is unable to lift heavy. Her back hurts if she bends over and her legs feel weak. Patient has been out of work since the 18th when she received the vaccine due to the symptoms she was having, her employer sent her home. Patient has been taking 600 mg of Ibuprofen and is not helping. However, the headaches have resolved.  Please advise

## 2023-06-13 NOTE — Telephone Encounter (Signed)
Patient has been scheduled

## 2023-06-14 ENCOUNTER — Encounter: Payer: Self-pay | Admitting: Internal Medicine

## 2023-06-14 ENCOUNTER — Ambulatory Visit: Payer: Self-pay | Admitting: Internal Medicine

## 2023-06-14 VITALS — BP 156/84 | HR 88 | Resp 20 | Ht <= 58 in | Wt 147.0 lb

## 2023-06-14 DIAGNOSIS — M5441 Lumbago with sciatica, right side: Secondary | ICD-10-CM

## 2023-06-14 MED ORDER — CYCLOBENZAPRINE HCL 5 MG PO TABS: ORAL_TABLET | ORAL | 0 refills | Status: DC

## 2023-06-14 MED ORDER — NAPROXEN 500 MG PO TABS: ORAL_TABLET | ORAL | 0 refills | Status: DC

## 2023-06-14 NOTE — Progress Notes (Signed)
 Subjective:    Patient ID: Meghan Walker, female   DOB: 1971/02/17, 52 y.o.   MRN: [id number]   HPI  Erminio Bloomer interprets  Here with continued symptoms after receiving COVID vaccine 6 days ago in morning.  Developed chills and feeling of fever, headaches, body aches that evening.   Worse the next morning.   Called the next morning.  Ultimately, took ibuprofen  400 mg as she stated she did not feel Tylenol  ever works for her and also did not have any at home. Ibuprofen  helped a bit for headache.   She continued to take the Ibuprofen  600 mg every 8 hours as continued with symptoms, especially aching in mid to lower posterior thorax bilaterally.  Hurt all the time, but worse if she coughed.  No increase with deep breath. Cough was minimal and non productive.   That discomfort, fever, chills and headache have all resolved.  Much of that was gone 3 days ago.    She has not gone back to work as she continues to have pain in bilateral low back to legs.  States this started about 4 days ago as part of the whole back hurting.  Goes to her buttocks and mildly high lateral thighs.   No definite numbness or tingling.  Just feels she has no strength in her legs as well. 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  Has not gone back to work.      Current Meds  Medication Sig   Ascorbic Acid (VITAMIN C) 100 MG tablet Take 100 mg by mouth daily. Unsure of dose   calcium -vitamin D (OSCAL WITH D) 500-5 MG-MCG tablet Take 1 tablet by mouth 2 (two) times daily.   ibuprofen  (ADVIL ) 600 MG tablet Take 600 mg by mouth every 6 (six) hours as needed.   No Known Allergies   Review of Systems    Objective:   BP (!) 156/84 (BP Location: Right Arm, Patient Position: Sitting, Cuff Size: Normal)   Pulse 88   Resp 20   Ht 4' 8 (1.422 m)   Wt 147 lb (66.7 kg)   LMP 08/19/2018 Comment: No periods for 2 years 12/21  BMI 32.96 kg/m   Physical Exam Constitutional:       Appearance: Normal appearance.  Cardiovascular:     Rate and Rhythm: Normal rate and regular rhythm.     Pulses: Normal pulses.     Heart sounds: No murmur heard.    No friction rub.  Pulmonary:     Effort: Pulmonary effort is normal.     Breath sounds: Normal breath sounds.  Abdominal:     General: Bowel sounds are normal.     Palpations: Abdomen is soft. There is no mass.     Tenderness: There is no abdominal tenderness.     Hernia: No hernia is present.  Musculoskeletal:        General: Tenderness present.     Lumbar back: Tenderness present. No bony tenderness.  Neurological:     Mental Status: She is alert.     Cranial Nerves: Cranial nerves 2-12 are intact.     Sensory: Sensation is intact.     Motor: Motor function is intact.     Coordination: Coordination is intact.     Gait: Gait is intact.     Deep Tendon Reflexes: Reflexes are normal and symmetric.      Assessment & Plan   Low back pain:  muscular vs L/S radiculopathy:  Naproxen  500 mg twice daily with food for next 1-2 weeks and then as needed.  Cyclobenzaprine  5 to 10 mg mainly at bedtime.    2.  COVID vaccine side effects:  resolved.  Call if no improvement and will consider PT

## 2023-06-22 ENCOUNTER — Ambulatory Visit: Payer: Self-pay | Admitting: Hematology and Oncology

## 2023-06-22 VITALS — BP 124/73 | Wt 146.9 lb

## 2023-06-22 DIAGNOSIS — Z1239 Encounter for other screening for malignant neoplasm of breast: Secondary | ICD-10-CM

## 2023-06-22 NOTE — Patient Instructions (Signed)
Taught Meghan Walker about self breast awareness and gave educational materials to take home. Patient did not need a Pap smear today due to last Pap smear was in 10/25/2021 per patient.  Let her know [facility] will cover Pap smears every 5 years unless has a history of abnormal Pap smears. Referred patient to the [facility] for diagnostic mammogram. Appointment scheduled for 06/22/2023. Patient aware of appointment and will be there. Let patient know will follow up with her within the next couple weeks with results. Meghan Walker verbalized understanding.  Pascal Lux, NP 1:23 PM

## 2023-06-22 NOTE — Progress Notes (Signed)
Ms. Meghan Walker is a 51 y.o. female who presents to [facility] clinic today with no complaints.    Pap Smear: Pap not smear completed today. Last Pap smear was 10/25/2021 and was normal. Per patient has no history of an abnormal Pap smear. Last Pap smear result is available in Epic.   Physical exam: Breasts Breasts symmetrical. No skin abnormalities bilateral breasts. No nipple retraction bilateral breasts. No nipple discharge bilateral breasts. No lymphadenopathy. No lumps palpated bilateral breasts.       Pelvic/Bimanual Pap is not indicated today    Smoking History: Patient has never smoked and was not referred to quit line.    Patient Navigation: Patient education provided. Access to services provided for patient through [facility] program. Meghan Walker interpreter provided. No transportation provided   Colorectal Cancer Screening: Per patient has never had colonoscopy completed No complaints today. FIT test negative 04/28/2023   Breast and Cervical Cancer Risk Assessment: Patient does not have family history of breast cancer, known genetic mutations, or radiation treatment to the chest before age 44. Patient does not have history of cervical dysplasia, immunocompromised, or DES exposure in-utero.  Risk Scores as of Encounter on 06/22/2023     Meghan Walker           5-year 0.67%   Lifetime 6.62%   This patient is [place] but has no documented birth country, so the [place] model used data from [place] patients to calculate their risk score. Document a birth country in the Demographics activity for a more accurate score.         Last calculated by Meghan Walker, CMA on 06/22/2023 at  1:32 PM          A: [facility] exam without pap smear No complaints with benign exam.  P: Referred patient to the [facility] for a screening mammogram. Appointment scheduled 06/22/2023.  [name] A, NP 06/22/2023 1:17 PM

## 2023-06-23 NOTE — Telephone Encounter (Signed)
Patient seen on 23rd

## 2023-08-29 ENCOUNTER — Other Ambulatory Visit (INDEPENDENT_AMBULATORY_CARE_PROVIDER_SITE_OTHER): Payer: Self-pay

## 2023-08-29 DIAGNOSIS — R300 Dysuria: Secondary | ICD-10-CM

## 2023-08-29 LAB — POCT URINALYSIS DIPSTICK
Bilirubin, UA: NEGATIVE
Glucose, UA: NEGATIVE
Ketones, UA: NEGATIVE
Leukocytes, UA: NEGATIVE
Nitrite, UA: NEGATIVE
Protein, UA: NEGATIVE
Spec Grav, UA: >=1.03 — AB (ref 1.010–1.025)
Urobilinogen, UA: 0.2 U/dL
pH, UA: 6 (ref 5.0–8.0)

## 2023-08-29 MED ORDER — FLUCONAZOLE 150 MG PO TABS
150.0000 mg | ORAL_TABLET | Freq: Every day | ORAL | 0 refills | Status: AC
Start: 2023-08-29 — End: 2023-08-31

## 2023-08-29 NOTE — Progress Notes (Unsigned)
 Patient came in after experiencing dysuria, back pain, urine odor, white vaginal discharge and itching on the outside of her genital area for the past 3 days. Patient has not taken any medication.

## 2023-08-31 LAB — URINE CULTURE

## 2023-09-04 ENCOUNTER — Other Ambulatory Visit: Payer: Self-pay

## 2023-09-04 MED ORDER — CIPROFLOXACIN HCL 500 MG PO TABS
500.0000 mg | ORAL_TABLET | Freq: Two times a day (BID) | ORAL | 0 refills | Status: AC
Start: 2023-09-04 — End: 2023-09-11

## 2023-10-16 ENCOUNTER — Encounter: Payer: Self-pay | Admitting: Internal Medicine

## 2023-10-16 ENCOUNTER — Ambulatory Visit: Payer: Self-pay | Admitting: Internal Medicine

## 2023-10-16 VITALS — BP 124/84 | HR 86 | Resp 16 | Ht <= 58 in | Wt 149.0 lb

## 2023-10-16 DIAGNOSIS — R300 Dysuria: Secondary | ICD-10-CM

## 2023-10-16 DIAGNOSIS — N9489 Other specified conditions associated with female genital organs and menstrual cycle: Secondary | ICD-10-CM

## 2023-10-16 LAB — POCT URINALYSIS DIPSTICK
Bilirubin, UA: NEGATIVE
Blood, UA: POSITIVE
Glucose, UA: NEGATIVE
Ketones, UA: NEGATIVE
Leukocytes, UA: NEGATIVE
Nitrite, UA: NEGATIVE
Protein, UA: NEGATIVE
Spec Grav, UA: 1.025 (ref 1.010–1.025)
Urobilinogen, UA: 0.2 U/dL
pH, UA: 6.5 (ref 5.0–8.0)

## 2023-10-16 LAB — POCT WET PREP WITH KOH
Clue Cells Wet Prep HPF POC: NEGATIVE
KOH Prep POC: NEGATIVE
RBC Wet Prep HPF POC: NEGATIVE
Trichomonas, UA: NEGATIVE
Yeast Wet Prep HPF POC: NEGATIVE

## 2023-10-16 MED ORDER — NITROFURANTOIN MONOHYD MACRO 100 MG PO CAPS: ORAL_CAPSULE | ORAL | 0 refills | Status: DC

## 2023-10-16 NOTE — Progress Notes (Signed)
    Subjective:    Patient ID: Meghan Walker, female   DOB: 1971-08-16, 53 y.o.   MRN: [id number]   HPI  Amparo Bristol interprets  No longer having issues with headache.     Burning in Vulvar area and with urination.  Does have thick white discharge.  Some itching.  Mild odor.  Problem about 5 days ago.  Has not been on antibiotics recently.  She was treated with fluconazole just last  month for 3 days with improvement of symptoms.  History of prediabetes.  Current Meds  Medication Sig   Ascorbic Acid (VITAMIN C) 100 MG tablet Take 100 mg by mouth daily. Unsure of dose   calcium-vitamin D (OSCAL WITH D) 500-5 MG-MCG tablet Take 1 tablet by mouth 2 (two) times daily.   No Known Allergies   Review of Systems    Objective:   BP 124/84 (BP Location: Right Arm, Patient Position: Sitting, Cuff Size: Normal)   Pulse 86   Resp 16   Ht 4\' 8"  (1.422 m)   Wt 149 lb (67.6 kg)   LMP 08/19/2018 Comment: No periods for 2 years 12/21  SpO2 99%   BMI 33.41 kg/m   Physical Exam NAD Lungs:  CTA CV:  RRR  Abd:  S, Mild tenderness in suprapubic and LLQ areas.  No rebound or peritoneal signs.  Minimal white vaginal discharge.  Complains of pain with initial insertion of speculum, even with pederson.  No erythema or lesion of cervical or vaginal mucosa.  No definite CMT.  No mass.    Wet prep unremarkable.  Assessment & Plan  1 Dysuria:  no obvious GU findings.  Moderate blood with UA.  Treat for UTI.

## 2023-10-18 ENCOUNTER — Telehealth: Payer: Self-pay

## 2023-10-18 LAB — GC/CHLAMYDIA PROBE AMP
Chlamydia trachomatis, NAA: NEGATIVE
Neisseria Gonorrhoeae by PCR: NEGATIVE

## 2023-10-18 LAB — URINE CULTURE

## 2023-10-18 NOTE — Telephone Encounter (Signed)
 Patient called to report that her symptoms have not improved at all with Macrobid. She still has two doses left. Continues to have burning, itching, and odor. Her main concern is fatigue and leg weakness.

## 2023-10-19 MED ORDER — CIPROFLOXACIN HCL 500 MG PO TABS: 500.0000 mg | ORAL_TABLET | Freq: Two times a day (BID) | ORAL | 0 refills | Status: DC

## 2023-10-19 NOTE — Telephone Encounter (Signed)
 See lab note--I think I accidentally put in my note to you that I sent in Bactrim, but actually sent Cipro, which bacteria is also sensitive to

## 2023-10-19 NOTE — Telephone Encounter (Signed)
Patient has been notified of Rx  

## 2023-10-19 NOTE — Addendum Note (Signed)
 Addended by: Marcene Duos on: 10/19/2023 10:40 AM   Modules accepted: Orders

## 2023-10-26 ENCOUNTER — Other Ambulatory Visit: Payer: Self-pay

## 2023-10-26 DIAGNOSIS — R319 Hematuria, unspecified: Secondary | ICD-10-CM

## 2023-10-26 DIAGNOSIS — N390 Urinary tract infection, site not specified: Secondary | ICD-10-CM

## 2023-10-26 DIAGNOSIS — N9489 Other specified conditions associated with female genital organs and menstrual cycle: Secondary | ICD-10-CM

## 2023-10-26 LAB — POCT URINALYSIS DIPSTICK
Bilirubin, UA: NEGATIVE
Glucose, UA: NEGATIVE
Ketones, UA: NEGATIVE
Leukocytes, UA: NEGATIVE
Nitrite, UA: NEGATIVE
Protein, UA: NEGATIVE
Spec Grav, UA: 1.02 (ref 1.010–1.025)
Urobilinogen, UA: 0.2 U/dL
pH, UA: 6.5 (ref 5.0–8.0)

## 2023-10-26 MED ORDER — FLUCONAZOLE 150 MG PO TABS: 150.0000 mg | ORAL_TABLET | Freq: Once | ORAL | 0 refills | Status: AC

## 2023-10-26 NOTE — Progress Notes (Addendum)
 Patient has minimal burning on vaginal area from the inside and some white discharge. Still feels body weakness/fatigue although it is improved.   Patient is being retreated with 7 days of Cipro 500mg  BID. Patient told to call us if she develops white discharge after completing medication.

## 2023-10-27 MED ORDER — CIPROFLOXACIN HCL 500 MG PO TABS: 500.0000 mg | ORAL_TABLET | Freq: Two times a day (BID) | ORAL | 0 refills | Status: AC

## 2023-10-29 LAB — URINE CULTURE

## 2023-11-01 ENCOUNTER — Telehealth: Payer: Self-pay

## 2023-11-01 MED ORDER — FLUCONAZOLE 150 MG PO TABS: 150.0000 mg | ORAL_TABLET | Freq: Once | ORAL | 1 refills | Status: AC

## 2023-11-01 NOTE — Telephone Encounter (Signed)
 Patient called and stated that she has an yeast infection due to taking antibiotics for UTI Reports she has itching and feels grains while urinating.  States she does have discharge which is thick and white      Per mulberry take fluconazole 150 mg 1 tab daily for two days

## 2023-11-06 MED ORDER — NITROFURANTOIN MONOHYD MACRO 100 MG PO CAPS: ORAL_CAPSULE | ORAL | 0 refills | Status: DC

## 2023-11-06 MED ORDER — FLUCONAZOLE 150 MG PO TABS: ORAL_TABLET | ORAL | 0 refills | Status: DC

## 2023-11-06 NOTE — Addendum Note (Signed)
 Addended by: Marcene Duos on: 11/06/2023 11:24 AM   Modules accepted: Orders

## 2023-11-15 ENCOUNTER — Encounter: Payer: Self-pay | Admitting: Internal Medicine

## 2023-11-15 ENCOUNTER — Ambulatory Visit (INDEPENDENT_AMBULATORY_CARE_PROVIDER_SITE_OTHER): Payer: Self-pay | Admitting: Internal Medicine

## 2023-11-15 VITALS — BP 110/64 | HR 77 | Temp 97.6°F | Resp 16 | Ht <= 58 in | Wt 145.5 lb

## 2023-11-15 DIAGNOSIS — J302 Other seasonal allergic rhinitis: Secondary | ICD-10-CM

## 2023-11-15 DIAGNOSIS — R3129 Other microscopic hematuria: Secondary | ICD-10-CM

## 2023-11-15 DIAGNOSIS — H9201 Otalgia, right ear: Secondary | ICD-10-CM

## 2023-11-15 DIAGNOSIS — R300 Dysuria: Secondary | ICD-10-CM

## 2023-11-15 LAB — POCT URINALYSIS DIPSTICK
Bilirubin, UA: NEGATIVE
Glucose, UA: NEGATIVE
Ketones, UA: NEGATIVE
Leukocytes, UA: NEGATIVE
Nitrite, UA: NEGATIVE
Protein, UA: NEGATIVE
Spec Grav, UA: 1.02 (ref 1.010–1.025)
Urobilinogen, UA: 0.2 U/dL
pH, UA: 6 (ref 5.0–8.0)

## 2023-11-15 MED ORDER — LORATADINE 10 MG PO TABS: 10.0000 mg | ORAL_TABLET | Freq: Every day | ORAL | Status: DC

## 2023-11-15 NOTE — Progress Notes (Signed)
    Subjective:    Patient ID: Meghan Walker, female   DOB: [birth year], 53 y.o.   MRN: [id number]   HPI  Meghan Walker  Here for follow up of recurrent UTI with different organisms each time.  January with E coli.  February with Klebsiella and Staph haemolyticus, all with concerning resistance.  She is currently taking Nitrofurantoin for a 10 day course for the latter.  She is no longer symptomatic with dysuria.  States about the 6th day of treatment, her symptoms resolved.   She relates her symptoms with the first UTI E coli did not completely resolve, though improved--still with a bit of dysuria.   Klebsiella UTI in February, she felt similarly, that she was still having dysuria and perhaps some low back pain.   3rd culture grew Staph as above and started the Nitrofurantoin 10 days ago.   All of her symptoms have resolved --no dysuria or back pain.  Has had frequent UTIs since she was about 28 years.  Seems to be more frequent in recent years, perhaps since she went through menopause.    She is sexually active.  She does urinate after intercourse and is monogamous.  She drinks 3 to 4  seventeen oz bottles of water daily.   Drinks a 12 oz coffee daily. No sodas.    2.  Right ear pain since 2 days ago.  Hurts to touch back of pinnae behind lobe as well.  Cannot recall water in ear recently.  She is having a bit of a sore throat and throat also dry in the morning.   No fever.   No cough.   No posterior pharyngeal drainage.   No low of hearing  Current Meds  Medication Sig   Ascorbic Acid (VITAMIN C) 100 MG tablet Take 100 mg by mouth daily. Unsure of dose   calcium-vitamin D (OSCAL WITH D) 500-5 MG-MCG tablet Take 1 tablet by mouth 2 (two) times daily.   nitrofurantoin, macrocrystal-monohydrate, (MACROBID) 100 MG capsule 1 cap by mouth twice daily for 10 days.   No Known Allergies   Review of Systems    Objective:   BP 110/64 (BP Location: Right Arm, Patient  Position: Sitting, Cuff Size: Normal)   Pulse 77   Temp 97.6 F (36.4 C)   Resp 16   Ht 4\' 8"  (1.422 m)   Wt 145 lb 8 oz (66 kg)   LMP 08/19/2018 Comment: No periods for 2 years 12/21  BMI 32.62 kg/m   Physical Exam NAD HEENT:  PERRL, EOMI, conjunctivae without injection.  TMs pearly gray and canals normal.  NT over left tragus and no retroauricular swelling or erythema or adenopathy Nasal mucosa boggy with clear discharge.   Throat without injection.  No cobbling  Neck:  Supple, No adenopathy Chest:  CTA CV:  RRR without murmur or rub. Abd:  S, NT, No HSM or mass, + BS.  No flank tenderness.     Assessment & Plan   Recurrent UTI:  long history of this, but seems more frequent.  Increase fluid intake.  Urinate after intercourse and use wipe front to back.  Sending UA for microscopic to confirm blood.  Complete abx   Urine culture to confirm clearance.  Do not hold urine--if feel the urge, go urinate.  Consider topical estrogen cream twice weekly if continues to recru.  2.  Right ear pain:  appears to have seasonal allergies.  Loratadine 10 mg daily.

## 2023-11-17 LAB — URINE CULTURE

## 2023-12-12 ENCOUNTER — Other Ambulatory Visit: Payer: Self-pay

## 2023-12-12 DIAGNOSIS — R300 Dysuria: Secondary | ICD-10-CM

## 2023-12-13 LAB — URINALYSIS, MICROSCOPIC ONLY
Bacteria, UA: NONE SEEN
Casts: NONE SEEN /LPF
Epithelial Cells (non renal): NONE SEEN /HPF (ref 0–10)
WBC, UA: NONE SEEN /HPF (ref 0–5)

## 2024-04-12 ENCOUNTER — Other Ambulatory Visit: Payer: Self-pay

## 2024-04-12 ENCOUNTER — Telehealth: Payer: Self-pay | Admitting: Internal Medicine

## 2024-04-12 DIAGNOSIS — R7303 Prediabetes: Secondary | ICD-10-CM

## 2024-04-12 DIAGNOSIS — Z0000 Encounter for general adult medical examination without abnormal findings: Secondary | ICD-10-CM

## 2024-04-12 NOTE — Telephone Encounter (Signed)
 Patient had to rescheduled her CPE appointment due to recent change of job and can not ask for days off.   Patient would like to be on wait list for a sooner appointment . Patient available only in the mornings.

## 2024-04-13 LAB — COMPREHENSIVE METABOLIC PANEL WITH GFR
ALT: 38 IU/L — ABNORMAL HIGH (ref 0–32)
AST: 20 IU/L (ref 0–40)
Albumin: 4.5 g/dL (ref 3.9–4.9)
Alkaline Phosphatase: 94 IU/L (ref 44–121)
BUN/Creatinine Ratio: 24 — ABNORMAL HIGH (ref 9–23)
BUN: 15 mg/dL (ref 6–24)
Bilirubin Total: 0.4 mg/dL (ref 0.0–1.2)
CO2: 19 mmol/L — ABNORMAL LOW (ref 20–29)
Calcium: 9.3 mg/dL (ref 8.7–10.2)
Chloride: 102 mmol/L (ref 96–106)
Creatinine, Ser: 0.62 mg/dL (ref 0.57–1.00)
Globulin, Total: 2.4 g/dL (ref 1.5–4.5)
Glucose: 112 mg/dL — ABNORMAL HIGH (ref 70–99)
Potassium: 4.1 mmol/L (ref 3.5–5.2)
Sodium: 138 mmol/L (ref 134–144)
Total Protein: 6.9 g/dL (ref 6.0–8.5)
eGFR: 108 mL/min/1.73 (ref >59)

## 2024-04-13 LAB — LIPID PANEL W/O CHOL/HDL RATIO
Cholesterol, Total: 178 mg/dL (ref 100–199)
HDL: 66 mg/dL (ref >39)
LDL Chol Calc (NIH): 95 mg/dL (ref 0–99)
Triglycerides: 95 mg/dL (ref 0–149)
VLDL Cholesterol Cal: 17 mg/dL (ref 5–40)

## 2024-04-13 LAB — HEMOGLOBIN A1C
Est. average glucose Bld gHb Est-mCnc: 126 mg/dL
Hgb A1c MFr Bld: 6 % — ABNORMAL HIGH (ref 4.8–5.6)

## 2024-04-13 LAB — CBC WITH DIFFERENTIAL/PLATELET
Basophils Absolute: 0.1 x10E3/uL (ref 0.0–0.2)
Basos: 1 %
EOS (ABSOLUTE): 0.2 x10E3/uL (ref 0.0–0.4)
Eos: 2 %
Hematocrit: 41.1 % (ref 34.0–46.6)
Hemoglobin: 13.2 g/dL (ref 11.1–15.9)
Immature Grans (Abs): 0 x10E3/uL (ref 0.0–0.1)
Immature Granulocytes: 0 %
Lymphocytes Absolute: 3.5 x10E3/uL — ABNORMAL HIGH (ref 0.7–3.1)
Lymphs: 47 %
MCH: 30.6 pg (ref 26.6–33.0)
MCHC: 32.1 g/dL (ref 31.5–35.7)
MCV: 95 fL (ref 79–97)
Monocytes Absolute: 0.5 x10E3/uL (ref 0.1–0.9)
Monocytes: 7 %
Neutrophils Absolute: 3.3 x10E3/uL (ref 1.4–7.0)
Neutrophils: 43 %
Platelets: 238 x10E3/uL (ref 150–450)
RBC: 4.32 x10E6/uL (ref 3.77–5.28)
RDW: 12.5 % (ref 11.7–15.4)
WBC: 7.6 x10E3/uL (ref 3.4–10.8)

## 2024-04-15 ENCOUNTER — Encounter: Payer: Self-pay | Admitting: Internal Medicine

## 2024-06-22 ENCOUNTER — Ambulatory Visit: Payer: Self-pay | Admitting: Internal Medicine

## 2024-07-08 ENCOUNTER — Ambulatory Visit
Admission: EM | Admit: 2024-07-08 | Discharge: 2024-07-08 | Disposition: A | Discharge status: Home / Self Care | Payer: Self-pay | Attending: Family Medicine | Admitting: Family Medicine

## 2024-07-08 ENCOUNTER — Encounter: Payer: Self-pay | Admitting: Emergency Medicine

## 2024-07-08 DIAGNOSIS — R109 Unspecified abdominal pain: Secondary | ICD-10-CM | POA: Insufficient documentation

## 2024-07-08 DIAGNOSIS — R112 Nausea with vomiting, unspecified: Secondary | ICD-10-CM | POA: Insufficient documentation

## 2024-07-08 DIAGNOSIS — R300 Dysuria: Secondary | ICD-10-CM | POA: Insufficient documentation

## 2024-07-08 LAB — POCT URINE DIPSTICK
Bilirubin, UA: NEGATIVE
Glucose, UA: NEGATIVE mg/dL
Nitrite, UA: NEGATIVE
POC PROTEIN,UA: NEGATIVE
Spec Grav, UA: >=1.03 — AB (ref 1.010–1.025)
Urobilinogen, UA: 0.2 U/dL
pH, UA: 5.5 (ref 5.0–8.0)

## 2024-07-08 MED ORDER — NITROFURANTOIN MONOHYD MACRO 100 MG PO CAPS: 100.0000 mg | ORAL_CAPSULE | Freq: Two times a day (BID) | ORAL | 0 refills | Status: DC

## 2024-07-08 MED ORDER — ONDANSETRON 4 MG PO TBDP: 4.0000 mg | ORAL_TABLET | Freq: Three times a day (TID) | ORAL | 0 refills | Status: DC | PRN

## 2024-07-08 MED ORDER — ONDANSETRON 4 MG PO TBDP
4.0000 mg | ORAL_TABLET | Freq: Once | ORAL | Status: AC
  Administered 2024-07-08: 4 mg via ORAL

## 2024-07-08 NOTE — Discharge Instructions (Signed)
 Hoy la atend por sntomas de infeccin urinaria, nuseas y vmitos. Le estoy recetando un antibitico para la infeccin urinaria. Tambin le he enviado medicamentos para las nuseas y los vmitos. Por favor, descanse y beba ms lquidos.  He enviado ignacia jubilee de orina al laboratorio para realizar ms pruebas.  Si el dolor empeora, o si presenta nuseas, vmitos, fiebre o escalofros, por favor acuda a urgencias para una evaluacin ms exhaustiva.  You were seen today for UTI symptoms, nausea and vomiting.  I am treating you for a UTI with an antibiotic to the pharmacy.  I have also sent out medication for nausea and vomiting.  Please get rest and increase fluids.  I have sent the urine to the lab for further testing as well.  If you have worsening pain, nausea, vomiting, fevers or chills please go to the ER for further evaluation.

## 2024-07-08 NOTE — ED Provider Notes (Addendum)
 [facility]    CSN: [id number] Arrival date & time: 07/08/24  1403      History   Chief Complaint Chief Complaint  Patient presents with   Dysuria   Abdominal Pain   Nausea   Emesis    HPI Meghan Walker is a 53 y.o. female.    Dysuria Associated symptoms: abdominal pain and vomiting   Abdominal Pain Associated symptoms: chills, dysuria and vomiting   Emesis Associated symptoms: abdominal pain and chills    Patient's daughter is translating.  Patient is here for dysuria, frequency, abd pain x 3 days.  She is also having some n/v, last episode was about 1-2 hrs ago.  She felt hot yesterday, with chills, but not sure about fever per se.  Some right back pain as well.  No otc meds taken.  No h/o kidney stones, but she has had recent UTIs, and this feels similar.  She will sometimes get nauseated with utis in the past.            Past Medical History:  Diagnosis Date   Anxiety    Depression    Endometriosis    Hemorrhoids    Irregular menstruation    UTI (lower urinary tract infection)     Patient Active Problem List   Diagnosis Date Noted   History of nipple discharge 04/13/2023   Prediabetes 04/13/2023   Chronic tension-type headache, not intractable 04/13/2023   Subconjunctival hemorrhage of left eye 12/12/2022   Greater trochanteric bursitis of both hips 02/28/2022   History of recurrent UTI (urinary tract infection) 08/19/2020   Left lower quadrant pain 08/19/2020   Bacterial vaginosis 01/29/2020   Recurrent UTI 10/23/2019   Menopause 10/23/2019   Laceration of left hand 05/08/2019   Encounter for other specified aftercare 05/01/2019   Pain of left hand 05/01/2019   Endometriosis determined by laparoscopy 06/26/2015   Right ovarian cyst 06/26/2015   Rectal bleeding 07/08/2014   AP (abdominal pain) 07/08/2014   Hemorrhoid 05/12/2014    Past Surgical History:  Procedure Laterality Date   APPENDECTOMY     CESAREAN  SECTION     1990, 1994, 2005   HAND SURGERY Left 2020   Required artery, tendon, maybe nerve repair   I & D EXTREMITY Left 04/16/2019   Procedure: IRRIGATION AND DEBRIDEMENT EXTREMITY;  Surgeon: Camella Fallow, MD;  Location: [facility] OR;  Service: Orthopedics;  Laterality: Left;   LAPAROSCOPIC SALPINGOOPHERECTOMY Right 06/2015   Findings of endometrioma of right ovary and lysis of adhesions.  Endometrial rests also cauterized on bladder peritoneum.    OB History     Gravida  3   Para  3   Term  3   Preterm  0   AB  0   Living         SAB  0   IAB  0   Ectopic  0   Multiple      Live Births               Home Medications    Prior to Admission medications   Medication Sig Start Date End Date Taking? Authorizing Provider  Ascorbic Acid (VITAMIN C) 100 MG tablet Take 100 mg by mouth daily. Unsure of dose Patient not taking: Reported on 07/08/2024    [provider]  calcium -vitamin D (OSCAL WITH D) 500-5 MG-MCG tablet Take 1 tablet by mouth 2 (two) times daily. Patient not taking: Reported on 07/08/2024 04/13/23   Adella Norris,  MD  cyclobenzaprine  (FLEXERIL ) 5 MG tablet 1/2 to 1 tab by mouth every 8 hours for back pain especially at bedtime Patient not taking: Reported on 11/15/2023 06/14/23   Adella Norris, MD  loratadine  (CLARITIN ) 10 MG tablet Take 1 tablet (10 mg total) by mouth daily. Patient not taking: Reported on 07/08/2024 11/15/23   Adella Norris, MD  nitrofurantoin , macrocrystal-monohydrate, (MACROBID ) 100 MG capsule 1 cap by mouth twice daily for 10 days. Patient not taking: Reported on 07/08/2024 11/06/23   Adella Norris, MD    Family History Family History  Problem Relation Age of Onset   Heart murmur Mother    Ulcers Mother    Hypertension Father    Kidney disease Father        Not from DM--patient states from gout   Gout Father    Diabetes Father    Kidney Stones Sister    Other Brother        Fatty liver    Asthma Daughter    Urinary tract infection Daughter        problem since 42 months old.  ureteral reflux.   Stroke Maternal Grandmother     Social History Social History   Tobacco Use   Smoking status: Never    Passive exposure: Never   Smokeless tobacco: Never  Vaping Use   Vaping status: Never Used  Substance Use Topics   Alcohol use: No   Drug use: No     Allergies   Patient has no known allergies.   Review of Systems Review of Systems  Constitutional:  Positive for chills.  Gastrointestinal:  Positive for abdominal pain and vomiting.  Genitourinary:  Positive for dysuria and frequency.     Physical Exam Triage Vital Signs ED Triage Vitals  Encounter Vitals Group     BP 07/08/24 1439 114/78     Girls Systolic BP Percentile --      Girls Diastolic BP Percentile --      Boys Systolic BP Percentile --      Boys Diastolic BP Percentile --      Pulse Rate 07/08/24 1439 (!) 102     Resp 07/08/24 1439 16     Temp 07/08/24 1439 99.5 F (37.5 C)     Temp Source 07/08/24 1439 Oral     SpO2 07/08/24 1439 97 %     Weight --      Height --      Head Circumference --      Peak Flow --      Pain Score 07/08/24 1440 8     Pain Loc --      Pain Education --      Exclude from Growth Chart --    No data found.  Updated Vital Signs BP 114/78 (BP Location: Left Arm)   Pulse (!) 102   Temp 99.5 F (37.5 C) (Oral)   Resp 16   LMP 08/19/2018 Comment: No periods for 2 years 12/21  SpO2 97%   Visual Acuity Right Eye Distance:   Left Eye Distance:   Bilateral Distance:    Right Eye Near:   Left Eye Near:    Bilateral Near:     Physical Exam Constitutional:      General: She is not in acute distress.    Appearance: She is well-developed and normal weight. She is not ill-appearing or toxic-appearing.  Cardiovascular:     Rate and Rhythm: Normal rate and regular rhythm.  Pulmonary:  Effort: Pulmonary effort is normal.     Breath sounds: Normal breath  sounds.  Abdominal:     General: Bowel sounds are normal.     Palpations: Abdomen is soft.     Tenderness: There is generalized abdominal tenderness.  Neurological:     Mental Status: She is alert.      UC Treatments / Results  Labs (all labs ordered are listed, but only abnormal results are displayed) Labs Reviewed  POCT URINE DIPSTICK - Abnormal; Notable for the following components:      Result Value   Clarity, UA cloudy (*)    Ketones, POC UA small (15) (*)    Spec Grav, UA >=1.030 (*)    Blood, UA moderate (*)    Leukocytes, UA Trace (*)    All other components within normal limits  URINE CULTURE    EKG   Radiology No results found.  Procedures Procedures (including critical care time)  Medications Ordered in UC Medications  ondansetron  (ZOFRAN -ODT) disintegrating tablet 4 mg (4 mg Oral Given 07/08/24 1444)    Initial Impression / Assessment and Plan / UC Course  I have reviewed the triage vital signs and the nursing notes.  Pertinent labs & imaging results that were available during my care of the patient were reviewed by me and considered in my medical decision making (see chart for details).   Final Clinical Impressions(s) / UC Diagnoses   Final diagnoses:  Dysuria  Abdominal pain, unspecified abdominal location  Nausea and vomiting, unspecified vomiting type     Discharge Instructions      Hoy la atend por sntomas de infeccin urinaria, nuseas y vmitos. Le estoy recetando un antibitico para la infeccin urinaria. Tambin le he enviado medicamentos para las nuseas y los vmitos. Por favor, descanse y beba ms lquidos.  He enviado ignacia jubilee de orina al laboratorio para realizar ms pruebas.  Si el dolor empeora, o si presenta nuseas, vmitos, fiebre o escalofros, por favor acuda a urgencias para una evaluacin ms exhaustiva.  You were seen today for UTI symptoms, nausea and vomiting.  I am treating you for a UTI with an antibiotic to  the pharmacy.  I have also sent out medication for nausea and vomiting.  Please get rest and increase fluids.  I have sent the urine to the lab for further testing as well.  If you have worsening pain, nausea, vomiting, fevers or chills please go to the ER for further evaluation.     ED Prescriptions     Medication Sig Dispense Auth. Provider   ondansetron  (ZOFRAN -ODT) 4 MG disintegrating tablet Take 1 tablet (4 mg total) by mouth every 8 (eight) hours as needed for nausea or vomiting. 20 tablet Anyela Napierkowski, MD   nitrofurantoin , macrocrystal-monohydrate, (MACROBID ) 100 MG capsule Take 1 capsule (100 mg total) by mouth 2 (two) times daily for 7 days. 14 capsule Darral Longs, MD      PDMP not reviewed this encounter.   Darral Longs, MD 07/08/24 1526    Darral Longs, MD 07/08/24 [phone]

## 2024-07-08 NOTE — ED Triage Notes (Addendum)
 Pt reports dysuria, frequency, and generalized abdominal pain x3 days. Started experiencing nausea and emesis today. 3 emesis episodes total, last one within last 1.5hrs. No med use at home for symptoms. Denies diarrhea or constipation. Reports R flank pain also. Triage completed with spanish interpreter, Hadassah.

## 2024-07-10 LAB — URINE CULTURE: Culture: 10000 — AB

## 2024-07-11 ENCOUNTER — Ambulatory Visit (HOSPITAL_COMMUNITY): Payer: Self-pay

## 2024-07-12 NOTE — Telephone Encounter (Signed)
 Pt returned call. Via interpreter line, pt states urinary symptoms have improved, but she is still having some abd pain and nausea. Advised of results. Advised to continue abx and f/u with UC or PCP regarding abd pain. Verbalized understanding.

## 2024-07-15 ENCOUNTER — Encounter: Payer: Self-pay | Admitting: Internal Medicine

## 2024-07-15 ENCOUNTER — Ambulatory Visit: Payer: Self-pay | Admitting: Internal Medicine

## 2024-07-15 VITALS — BP 140/80 | HR 75 | Resp 20 | Ht <= 58 in | Wt 141.0 lb

## 2024-07-15 DIAGNOSIS — F419 Anxiety disorder, unspecified: Secondary | ICD-10-CM

## 2024-07-15 DIAGNOSIS — R1085 Abdominal pain of multiple sites: Secondary | ICD-10-CM

## 2024-07-15 DIAGNOSIS — F410 Panic disorder [episodic paroxysmal anxiety] without agoraphobia: Secondary | ICD-10-CM

## 2024-07-15 MED ORDER — ESCITALOPRAM OXALATE 10 MG PO TABS: ORAL_TABLET | ORAL | 4 refills | Status: AC

## 2024-07-15 MED ORDER — FAMOTIDINE 40 MG PO TABS: ORAL_TABLET | ORAL | 1 refills | Status: AC

## 2024-07-15 NOTE — Progress Notes (Unsigned)
 Established Patient Office Visit  Subjective    Patient ID: Meghan Walker, female    DOB: [birth year]  Age: 53 y.o. MRN: [id number] I, MD in training, did H/P then presented to Dr. CHRISTELLA. She then did her own H/P, orders, discharge instructions, pt education Chief Complaint  Patient presents with   Abdominal Pain    History from pt and EPIC records:  8 days ago was seen at an urgent care for 3 days of  dysurea, frequency, nausea, vomiting 3 times, subjective fever, abdomenal pain, back pain. She was diagnosed with UTI, given Nitrofurantoin  and anti-emetic. The culture was pos for E.coli sens. to Nitrofurantoin . She completed the course 2 days ago. Review of UC note on EPIC confirms this.   Urinary sxs have resolved. The abdomenal pain persists, is mostly epigastric, occurs after eating and there is occasional nausea after eating.  She also has abdomenal pain around the umbilicus and LLQ.  Says actually the abdomenal pain started 2 weeks prior to UTI sxs.  She says she feels highly stressed as her husband has DM and due to immigration worries left her and went back home prior to the sxs onset.  Has had abdomenal pain like this before and was told it was UTI. + poor appetite. Had the covid and flu vaccine more than a month ago.  Cannot describe the pain very well but volunteers that she is worried that her nerves might be the cause No sick contacts. Also since husband has left, she feels sob and cp when she gets anxiety attacks( pt observed to sob during decription of her sxs).  Review of EPIC reveals that she has had LLQ pain before (2022) and had negative pelvic US .     No outpatient medications have been marked as taking for the 07/15/24 encounter (Office Visit) with Adella Norris, MD.    No Known Allergies   Past Medical History:  Diagnosis Date   Anxiety    Depression    Endometriosis    Hemorrhoids    Irregular menstruation    UTI (lower urinary tract infection)     Past Surgical History:  Procedure Laterality Date   APPENDECTOMY     CESAREAN SECTION     1990, 1994, 2005   HAND SURGERY Left 2020   Required artery, tendon, maybe nerve repair   I & D EXTREMITY Left 04/16/2019   Procedure: IRRIGATION AND DEBRIDEMENT EXTREMITY;  Surgeon: Camella Fallow, MD;  Location: [facility] OR;  Service: Orthopedics;  Laterality: Left;   LAPAROSCOPIC SALPINGOOPHERECTOMY Right 06/2015   Findings of endometrioma of right ovary and lysis of adhesions.  Endometrial rests also cauterized on bladder peritoneum.    Review of Systems  Constitutional:  Positive for fever (subjective; review of records from UC does not show fever). Negative for chills and malaise/fatigue.  HENT:  Negative for sore throat.   Respiratory:  Positive for shortness of breath (when she has anxiety attacks).   Cardiovascular:  Positive for chest pain (when she has anxiety attack).  Gastrointestinal:  Positive for abdominal pain, constipation, diarrhea (diarrhea for 2 weeks followed by constipation prior to sxs) and nausea.  Genitourinary:  Negative for dysuria, flank pain, hematuria and urgency.  Skin:  Negative for rash.  Neurological:  Positive for tingling.  Psychiatric/Behavioral:  Negative for suicidal ideas. The patient is nervous/anxious.       Objective:     BP (!) 140/80 (BP Location: Right Arm, Patient Position: Sitting, Cuff Size: Normal)   Pulse  75   Resp 20   Ht 4' 10 (1.473 m)   Wt 141 lb (64 kg)   LMP 08/19/2018 Comment: No periods for 2 years 12/21  BMI 29.47 kg/m   Physical Exam Vitals and nursing note reviewed.  Constitutional:      General: She is not in acute distress.    Appearance: She is well-developed. She is not ill-appearing, toxic-appearing or diaphoretic.  Eyes:     General: No scleral icterus. Pulmonary:     Effort: Pulmonary effort is normal.  Abdominal:     General: There is abdominal bruit (Mild-mod RLQ >LLQ tenderness.  +epigastric tenderness-mild. No  peritoneal signs.). There is no distension.     Comments: Chart reviewed: LLQ tenderness before; was evaluated for it before. Had negative pelvic US .   Skin:    Capillary Refill: Capillary refill takes less than 2 seconds.  Neurological:     Mental Status: She is alert.  Psychiatric:     Comments: Tearful, no SI, HI, +anxiety due to husband leaving prior to her sxs      Assessment & Plan:   1. Anxiety and depression -Lexapro  -SW consult 2. Panic attacks- Same as number 1 3. Abdominal pain of multiple sites- No acute abdomen-Seems related to #1 -Pepcid   RTC if worse Caron Kaiser, MD

## 2024-09-10 ENCOUNTER — Other Ambulatory Visit: Payer: Self-pay | Admitting: Psychology

## 2024-11-05 ENCOUNTER — Encounter: Payer: Self-pay | Admitting: Internal Medicine
